# Patient Record
Sex: Male | Born: 1955 | Race: Black or African American | Hispanic: No | Smoking: Current some day smoker
Health system: Southern US, Community
[De-identification: ages and names within clinical notes are randomized; demographics above are authoritative.]

## PROBLEM LIST (undated history)

## (undated) DIAGNOSIS — M1990 Unspecified osteoarthritis, unspecified site: Secondary | ICD-10-CM

## (undated) DIAGNOSIS — I10 Essential (primary) hypertension: Secondary | ICD-10-CM

## (undated) DIAGNOSIS — K219 Gastro-esophageal reflux disease without esophagitis: Secondary | ICD-10-CM

## (undated) DIAGNOSIS — F419 Anxiety disorder, unspecified: Secondary | ICD-10-CM

## (undated) DIAGNOSIS — T7840XA Allergy, unspecified, initial encounter: Secondary | ICD-10-CM

## (undated) HISTORY — DX: Allergy, unspecified, initial encounter: T78.40XA

## (undated) HISTORY — PX: WISDOM TOOTH EXTRACTION: SHX21

## (undated) HISTORY — DX: Gastro-esophageal reflux disease without esophagitis: K21.9

## (undated) HISTORY — DX: Unspecified osteoarthritis, unspecified site: M19.90

## (undated) HISTORY — DX: Essential (primary) hypertension: I10

## (undated) HISTORY — DX: Anxiety disorder, unspecified: F41.9

## (undated) HISTORY — PX: HERNIA REPAIR: SHX51

---

## 2004-09-29 ENCOUNTER — Ambulatory Visit: Payer: Self-pay | Admitting: Pulmonary Disease

## 2005-10-21 ENCOUNTER — Ambulatory Visit: Payer: Self-pay | Admitting: Pulmonary Disease

## 2006-07-20 ENCOUNTER — Ambulatory Visit: Payer: Self-pay | Admitting: Pulmonary Disease

## 2008-03-11 DIAGNOSIS — T7840XA Allergy, unspecified, initial encounter: Secondary | ICD-10-CM | POA: Insufficient documentation

## 2008-03-11 DIAGNOSIS — K219 Gastro-esophageal reflux disease without esophagitis: Secondary | ICD-10-CM | POA: Insufficient documentation

## 2008-03-11 DIAGNOSIS — I10 Essential (primary) hypertension: Secondary | ICD-10-CM

## 2008-10-22 ENCOUNTER — Ambulatory Visit: Payer: Self-pay | Admitting: Pulmonary Disease

## 2008-11-16 DIAGNOSIS — M1990 Unspecified osteoarthritis, unspecified site: Secondary | ICD-10-CM | POA: Insufficient documentation

## 2008-11-16 DIAGNOSIS — F411 Generalized anxiety disorder: Secondary | ICD-10-CM | POA: Insufficient documentation

## 2008-12-31 ENCOUNTER — Ambulatory Visit: Payer: Self-pay | Admitting: Gastroenterology

## 2009-01-19 ENCOUNTER — Ambulatory Visit: Payer: Self-pay | Admitting: Gastroenterology

## 2009-01-19 ENCOUNTER — Encounter: Payer: Self-pay | Admitting: Gastroenterology

## 2009-01-20 ENCOUNTER — Encounter: Payer: Self-pay | Admitting: Gastroenterology

## 2010-12-12 LAB — CONVERTED CEMR LAB
ALT: 17 units/L (ref 0–53)
Albumin: 3.9 g/dL (ref 3.5–5.2)
Basophils Absolute: 0 10*3/uL (ref 0.0–0.1)
Basophils Relative: 0.2 % (ref 0.0–3.0)
CO2: 33 meq/L — ABNORMAL HIGH (ref 19–32)
Calcium: 9.4 mg/dL (ref 8.4–10.5)
Cholesterol: 137 mg/dL (ref 0–200)
Creatinine, Ser: 1.1 mg/dL (ref 0.4–1.5)
Hemoglobin: 14.7 g/dL (ref 13.0–17.0)
LDL Cholesterol: 80 mg/dL (ref 0–99)
Lymphocytes Relative: 31.9 % (ref 12.0–46.0)
MCHC: 33.7 g/dL (ref 30.0–36.0)
MCV: 91.2 fL (ref 78.0–100.0)
Neutro Abs: 2.9 10*3/uL (ref 1.4–7.7)
PSA: 0.93 ng/mL (ref 0.10–4.00)
RBC: 4.79 M/uL (ref 4.22–5.81)
TSH: 2.09 microintl units/mL (ref 0.35–5.50)
Total Bilirubin: 1.3 mg/dL — ABNORMAL HIGH (ref 0.3–1.2)
Total Protein: 6.8 g/dL (ref 6.0–8.3)

## 2011-02-17 ENCOUNTER — Encounter: Payer: Self-pay | Admitting: Pulmonary Disease

## 2011-02-21 ENCOUNTER — Ambulatory Visit (INDEPENDENT_AMBULATORY_CARE_PROVIDER_SITE_OTHER): Payer: BC Managed Care – HMO | Admitting: Pulmonary Disease

## 2011-02-21 ENCOUNTER — Ambulatory Visit (INDEPENDENT_AMBULATORY_CARE_PROVIDER_SITE_OTHER)
Admission: RE | Admit: 2011-02-21 | Discharge: 2011-02-21 | Disposition: A | Discharge status: Home / Self Care | Payer: BC Managed Care – HMO | Source: Ambulatory Visit | Attending: Pulmonary Disease | Admitting: Pulmonary Disease

## 2011-02-21 ENCOUNTER — Encounter: Payer: Self-pay | Admitting: Pulmonary Disease

## 2011-02-21 ENCOUNTER — Other Ambulatory Visit (INDEPENDENT_AMBULATORY_CARE_PROVIDER_SITE_OTHER): Payer: BC Managed Care – HMO

## 2011-02-21 VITALS — BP 126/72 | HR 70 | Temp 97.6°F | Ht 67.0 in | Wt 221.0 lb

## 2011-02-21 DIAGNOSIS — Z0000 Encounter for general adult medical examination without abnormal findings: Secondary | ICD-10-CM

## 2011-02-21 DIAGNOSIS — M1990 Unspecified osteoarthritis, unspecified site: Secondary | ICD-10-CM

## 2011-02-21 DIAGNOSIS — K219 Gastro-esophageal reflux disease without esophagitis: Secondary | ICD-10-CM

## 2011-02-21 DIAGNOSIS — Z0001 Encounter for general adult medical examination with abnormal findings: Secondary | ICD-10-CM | POA: Insufficient documentation

## 2011-02-21 DIAGNOSIS — K649 Unspecified hemorrhoids: Secondary | ICD-10-CM

## 2011-02-21 DIAGNOSIS — K635 Polyp of colon: Secondary | ICD-10-CM | POA: Insufficient documentation

## 2011-02-21 DIAGNOSIS — D126 Benign neoplasm of colon, unspecified: Secondary | ICD-10-CM

## 2011-02-21 LAB — BASIC METABOLIC PANEL
BUN: 13 mg/dL (ref 6–23)
Calcium: 9.1 mg/dL (ref 8.4–10.5)
GFR: 101.31 mL/min (ref >60.00)
Glucose, Bld: 89 mg/dL (ref 70–99)
Sodium: 139 mEq/L (ref 135–145)

## 2011-02-21 LAB — HEPATIC FUNCTION PANEL
AST: 19 U/L (ref 0–37)
Alkaline Phosphatase: 44 U/L (ref 39–117)
Bilirubin, Direct: 0.2 mg/dL (ref 0.0–0.3)
Total Bilirubin: 1.5 mg/dL — ABNORMAL HIGH (ref 0.3–1.2)

## 2011-02-21 LAB — LIPID PANEL
HDL: 43.4 mg/dL (ref >39.00)
LDL Cholesterol: 73 mg/dL (ref 0–99)
VLDL: 11.8 mg/dL (ref 0.0–40.0)

## 2011-02-21 LAB — TSH: TSH: 1.5 u[IU]/mL (ref 0.35–5.50)

## 2011-02-21 LAB — CBC WITH DIFFERENTIAL/PLATELET
Basophils Absolute: 0 10*3/uL (ref 0.0–0.1)
HCT: 43.3 % (ref 39.0–52.0)
Lymphs Abs: 2 10*3/uL (ref 0.7–4.0)
MCV: 91.3 fl (ref 78.0–100.0)
Monocytes Absolute: 0.6 10*3/uL (ref 0.1–1.0)
Neutro Abs: 4.5 10*3/uL (ref 1.4–7.7)
Platelets: 229 10*3/uL (ref 150.0–400.0)
RDW: 12.6 % (ref 11.5–14.6)

## 2011-02-21 NOTE — Assessment & Plan Note (Signed)
He denies reflux, heartburn etc except when eating tomato based foods & knows to avoid whenever poss... He has OTC Prilosec vs Zantac to use Prn when he forgets.Marland KitchenMarland Kitchen

## 2011-02-21 NOTE — Patient Instructions (Signed)
Today we updated your med list in EPIC...  We did your follow up CXR, EKG, & Fasting blood work>    Please call the [facility] in a few days for your results...    Dial [id number] & when prompted enter your patient number followed by the # symbol...    Your patient number is:  [id number]  Let's get on track w/ our diet & exercise progam>    It would be great to lose 20-30 lbs over the next yr...  Call for any problems... Let's plan a follow up eval in 1-2 yrs.Marland KitchenMarland Kitchen

## 2011-02-21 NOTE — Progress Notes (Signed)
Subjective:    Patient ID: Isaiah Rice, male    DOB: [birth year], 55 y.o.   MRN: [id number]  HPI 55 y/o BM here for a follow up visit and CPX... he enjoys good general medical health & has no new complaints or concerns today... He uses several OTC meds as needed> Antihist, Anti-inflamm, Prilosec, Asa daily, topical anti-inflamm cream...  Current Problems:   PHYSICAL EXAMINATION (ICD-V70.0) - good general health;  had routine screening colonoscopy 3/10 by drStark showed 5mm hyperplastic polyp in rectum, otherw neg x hems & f/u rec in 68yrs... DRE is neg & PSAs have been WNL.Marland KitchenMarland Kitchen  Immunizations> doesn't get Flu shots, Pneumovax will be due at 66, ?when last Tetanus was...  ALLERGY (ICD-995.3) - uses OTC antihistamines as needed.  Hx of HYPERTENSION, BORDERLINE (ICD-401.9) - on low sodium diet alone... BP= 126/72, and similar at home... denies HA, fatigue, visual changes, CP, palipit, dizziness, syncope, dyspnea, edema, etc...   ESOPHAGEAL REFLUX (ICD-530.81) - uses OTC H2 blockers vs PPI as needed... states his symptom is mostly related to eating the wrong foods...  DEGENERATIVE JOINT DISEASE (ICD-715.90) - prev on Etodolac as needed, now uses OTC Advil vs Aleve... notes left shoulder and right knee pain on occas and has seen DrBeane for shot in the past...  ANXIETY (ICD-300.00) - stressful period w/ prev divorce, son living at home in college, etc...   Past Medical History  Diagnosis Date  . Allergy, unspecified not elsewhere classified   . Hypertension   . Esophageal reflux, Colon polyp, Hemorrhoid   . DJD (degenerative joint disease)   . Anxiety    Past Surgical History  Procedure Date  . Hernia repair    Outpatient Encounter Prescriptions as of 02/21/2011  Medication Sig Dispense Refill  . aspirin 81 MG tablet Take 81 mg by mouth daily.              . Multiple Vitamins-Minerals (MULTIVITAMIN WITH MINERALS) tablet Take 1 tablet by mouth daily.                     No Known  Allergies  History   Social History  . Marital Status: Divorced    Spouse Name: N/A    Number of Children: 1  . Years of Education: N/A   Occupational History  . Not on file.   Social History Main Topics  . Smoking status: Current Some Day Smoker    Types: Cigars  . Smokeless tobacco: Never Used   Comment: did smoke an occasional cigar once a year  . Alcohol Use: Yes     social etoh  . Drug Use: No  . Sexually Active: Not on file   Other Topics Concern  . Not on file   Social History Narrative  . No narrative on file   Family History  Problem Relation Age of Onset  . Lung cancer Father 19  . Other Mother     DJD and TKR  . Breast cancer Sister     Review of Systems    He notes occas knee pain.  The patient denies fever, chills, sweats, anorexia, fatigue, weakness, malaise, weight loss, sleep disorder, blurring, diplopia, eye irritation, eye discharge, vision loss, eye pain, photophobia, earache, ear discharge, tinnitus, decreased hearing, nasal congestion, nosebleeds, sore throat, hoarseness, chest pain, palpitations, syncope, dyspnea on exertion, orthopnea, PND, peripheral edema, cough, dyspnea at rest, excessive sputum, hemoptysis, wheezing, pleurisy, nausea, vomiting, diarrhea, constipation, change in bowel habits, abdominal pain, melena, hematochezia,  jaundice, gas/bloating, indigestion/heartburn, dysphagia, odynophagia, dysuria, hematuria, urinary frequency, urinary hesitancy, nocturia, incontinence, back pain, muscle cramps, muscle weakness, stiffness, sciatica, restless legs, leg pain at night, leg pain with exertion, rash, itching, dryness, suspicious lesions, paralysis, paresthesias, seizures, tremors, vertigo, transient blindness, frequent falls, frequent headaches, difficulty walking, depression, anxiety, memory loss, confusion, cold intolerance, heat intolerance, polydipsia, polyphagia, polyuria, unusual weight change, abnormal bruising, bleeding, enlarged lymph  nodes, urticaria, allergic rash, hay fever, and recurrent infections.     Objective:   Physical Exam    WD, sl overweight, 55 y/o BM in NAD... GENERAL:  Alert & oriented; pleasant & cooperative... HEENT:  [place]/AT, EOM-wnl, PERRLA, EACs-clear, TMs-wnl, NOSE-clear, THROAT-clear & wnl. NECK:  Supple w/ full ROM; no JVD; normal carotid impulses w/o bruits; no thyromegaly or nodules palpated; no lymphadenopathy. CHEST:  Clear to P & A; without wheezes/ rales/ or rhonchi. HEART:  Regular Rhythm; without murmurs/ rubs/ or gallops. ABDOMEN:  Soft & nontender; normal bowel sounds; no organomegaly or masses detected. RECTAL:  Neg - prostate 2+ & nontender w/o nodules; stool hematest neg. EXT: without deformities, mild arthritic changes in knee; no varicose veins/ venous insuffic/ or edema. NEURO:  CN's intact; motor testing normal; sensory testing normal; gait normal & balance OK. DERM:  No lesions noted; no rash etc...   Assessment & Plan:

## 2011-02-21 NOTE — Assessment & Plan Note (Signed)
Good general health> CXR, EKG, Fasting blood work ==> pending & we will review and get the results to him... Needs better diet & weight reduction> his BMI is ~35 & he will shoot for 20-30 lb weight reduction over the next yr.Marland KitchenMarland Kitchen

## 2011-02-21 NOTE — Assessment & Plan Note (Signed)
He notes occas discomfort in his knees and uses a brace when exercising etc... He gets relief from topical cream that he got from [place], and uses OTC Advil vs Aleve Prn... He will f/u w/ DrBeane Prn.Marland KitchenMarland Kitchen

## 2011-02-25 ENCOUNTER — Telehealth: Payer: Self-pay | Admitting: *Deleted

## 2011-02-25 MED ORDER — DOXYCYCLINE HYCLATE 100 MG PO TABS: 100.0000 mg | ORAL_TABLET | Freq: Two times a day (BID) | ORAL | Status: AC

## 2011-02-25 NOTE — Telephone Encounter (Signed)
Message copied by Abigail Miyamoto on Fri Feb 25, 2011 12:02 PM ------      Message from: NADEL, Lorin Picket      Created: Tue Feb 22, 2011  7:56 PM       Labs essent WNL x incr PSA> could be infection & rec Rx w/ Doxycycline 100mg  bid x10 d...      Then repeat PSA in 1 month without fail, we will call the report...  SN

## 2011-03-28 ENCOUNTER — Telehealth: Payer: Self-pay | Admitting: *Deleted

## 2011-03-28 ENCOUNTER — Other Ambulatory Visit: Payer: Self-pay | Admitting: Pulmonary Disease

## 2011-03-28 ENCOUNTER — Other Ambulatory Visit (INDEPENDENT_AMBULATORY_CARE_PROVIDER_SITE_OTHER): Payer: BC Managed Care – HMO

## 2011-03-28 DIAGNOSIS — R972 Elevated prostate specific antigen [PSA]: Secondary | ICD-10-CM

## 2011-03-28 NOTE — Telephone Encounter (Signed)
Called and spoke with pt and he is aware per SN of psa results after abx.  psa is back to normal and pt is aware that we will follow up in 1 year with repeat test.

## 2011-09-28 ENCOUNTER — Telehealth: Payer: Self-pay | Admitting: Pulmonary Disease

## 2011-09-28 NOTE — Telephone Encounter (Signed)
Pt returned Leigh's call & can be reached at [phone].  Isaiah Rice

## 2011-09-28 NOTE — Telephone Encounter (Signed)
Called and spoke with pts mother and she is going to have him call back to get his symptoms.

## 2011-09-28 NOTE — Telephone Encounter (Signed)
Spoke with Chrissie Noa Saephan-DOB: 03/15/1956. Pt states he is having chest congestion, cough-productive. Pt has appt with TP at 215pm for acute visit. Also, Eean is aware that SN is not taking new patients and I gave him names for PCP downstairs for his son.

## 2011-09-29 ENCOUNTER — Encounter: Payer: Self-pay | Admitting: Adult Health

## 2011-09-29 ENCOUNTER — Ambulatory Visit (INDEPENDENT_AMBULATORY_CARE_PROVIDER_SITE_OTHER): Payer: BC Managed Care – HMO | Admitting: Adult Health

## 2011-09-29 VITALS — BP 120/80 | HR 82 | Temp 97.3°F | Ht 67.0 in | Wt 224.6 lb

## 2011-09-29 DIAGNOSIS — J209 Acute bronchitis, unspecified: Secondary | ICD-10-CM | POA: Insufficient documentation

## 2011-09-29 MED ORDER — HYDROCODONE-HOMATROPINE 5-1.5 MG/5ML PO SYRP: 5.0000 mL | ORAL_SOLUTION | Freq: Four times a day (QID) | ORAL | Status: AC | PRN

## 2011-09-29 MED ORDER — AZITHROMYCIN 250 MG PO TABS: ORAL_TABLET | ORAL | Status: AC

## 2011-09-29 NOTE — Assessment & Plan Note (Signed)
Slow to resolve URI/early bronchitis   Plan;  Zpack take as directed.  Mucinex DM Twice daily  As needed  Cough/congestion  Fluids and rest  Hydromet 1-2 tsp every 4-6 hr As needed  Cough-may make you sleepy  Please contact office for sooner follow up if symptoms do not improve or worsen or seek emergency care

## 2011-09-29 NOTE — Progress Notes (Signed)
  Subjective:    Patient ID: Isaiah Rice, male    DOB: April 17, 1956, 55 y.o.   MRN: [id number]  HPI 55 yo male   09/29/2011 Acute OV  Complains of  chest congestion, drainage, minimally productive cough for 10 days. Taking otc cold meds with minimal relief. No hemoptysis , weight loss, fever or recent travel or abx use.  Cough is keeping him up at night. Starting to get worn down with cough.       Review of Systems Constitutional:   No  weight loss, night sweats,  Fevers, chills,  ++fatigue, or  lassitude.  HEENT:   No headaches,  Difficulty swallowing,  Tooth/dental problems, or  Sore throat,                No sneezing, itching, ear ache,  ++nasal congestion, post nasal drip,   CV:  No chest pain,  Orthopnea, PND, swelling in lower extremities, anasarca, dizziness, palpitations, syncope.   GI  No heartburn, indigestion, abdominal pain, nausea, vomiting, diarrhea, change in bowel habits, loss of appetite, bloody stools.   Resp:   No coughing up of blood.    No chest wall deformity  Skin: no rash or lesions.  GU: no dysuria, change in color of urine, no urgency or frequency.  No flank pain, no hematuria   MS:  No joint pain or swelling.  No decreased range of motion.  No back pain.  Psych:  No change in mood or affect. No depression or anxiety.  No memory loss.         Objective:   Physical Exam GEN: A/Ox3; pleasant , NAD, well nourished   HEENT:  [place]/AT,  EACs-clear, TMs-wnl, NOSE-clear drainage , THROAT-clear, no lesions, no postnasal drip or exudate noted.   NECK:  Supple w/ fair ROM; no JVD; normal carotid impulses w/o bruits; no thyromegaly or nodules palpated; no lymphadenopathy.  RESP  Coarse BS  w/o, wheezes/ rales/ or rhonchi.no accessory muscle use, no dullness to percussion  CARD:  RRR, no m/r/g  , no peripheral edema, pulses intact, no cyanosis or clubbing.  GI:   Soft & nt; nml bowel sounds; no organomegaly or masses detected.  Musco: Warm bil, no  deformities or joint swelling noted.   Neuro: alert, no focal deficits noted.    Skin: Warm, no lesions or rashes         Assessment & Plan:

## 2011-09-29 NOTE — Patient Instructions (Signed)
Zpack take as directed.  Mucinex DM Twice daily  As needed  Cough/congestion  Fluids and rest  Hydromet 1-2 tsp every 4-6 hr As needed  Cough-may make you sleepy  Please contact office for sooner follow up if symptoms do not improve or worsen or seek emergency care

## 2015-10-07 ENCOUNTER — Encounter: Payer: Self-pay | Admitting: Gastroenterology

## 2018-03-22 ENCOUNTER — Encounter: Payer: Self-pay | Admitting: Family Medicine

## 2018-03-22 ENCOUNTER — Ambulatory Visit: Payer: Managed Care, Other (non HMO) | Admitting: Family Medicine

## 2018-03-22 VITALS — BP 142/90 | HR 79 | Ht 67.0 in | Wt 234.0 lb

## 2018-03-22 DIAGNOSIS — Z0001 Encounter for general adult medical examination with abnormal findings: Secondary | ICD-10-CM

## 2018-03-22 DIAGNOSIS — M25571 Pain in right ankle and joints of right foot: Secondary | ICD-10-CM

## 2018-03-22 DIAGNOSIS — G4440 Drug-induced headache, not elsewhere classified, not intractable: Secondary | ICD-10-CM

## 2018-03-22 DIAGNOSIS — I10 Essential (primary) hypertension: Secondary | ICD-10-CM | POA: Diagnosis not present

## 2018-03-22 DIAGNOSIS — N529 Male erectile dysfunction, unspecified: Secondary | ICD-10-CM

## 2018-03-22 DIAGNOSIS — Z125 Encounter for screening for malignant neoplasm of prostate: Secondary | ICD-10-CM | POA: Diagnosis not present

## 2018-03-22 DIAGNOSIS — M25572 Pain in left ankle and joints of left foot: Secondary | ICD-10-CM

## 2018-03-22 LAB — URINALYSIS, ROUTINE W REFLEX MICROSCOPIC
BILIRUBIN URINE: NEGATIVE
Ketones, ur: NEGATIVE
LEUKOCYTES UA: NEGATIVE
NITRITE: NEGATIVE
Specific Gravity, Urine: 1.025 (ref 1.000–1.030)
Total Protein, Urine: 30 — AB
Urine Glucose: NEGATIVE
Urobilinogen, UA: 0.2 (ref 0.0–1.0)
pH: 6 (ref 5.0–8.0)

## 2018-03-22 LAB — CBC
HCT: 46.3 % (ref 39.0–52.0)
Hemoglobin: 15.1 g/dL (ref 13.0–17.0)
MCHC: 32.6 g/dL (ref 30.0–36.0)
MCV: 89.2 fl (ref 78.0–100.0)
Platelets: 263 10*3/uL (ref 150.0–400.0)
RBC: 5.19 Mil/uL (ref 4.22–5.81)
RDW: 12.9 % (ref 11.5–15.5)
WBC: 6.8 10*3/uL (ref 4.0–10.5)

## 2018-03-22 LAB — COMPREHENSIVE METABOLIC PANEL
ALBUMIN: 4.4 g/dL (ref 3.5–5.2)
ALT: 23 U/L (ref 0–53)
AST: 19 U/L (ref 0–37)
Alkaline Phosphatase: 60 U/L (ref 39–117)
BUN: 10 mg/dL (ref 6–23)
CHLORIDE: 103 meq/L (ref 96–112)
CO2: 31 meq/L (ref 19–32)
Calcium: 9.6 mg/dL (ref 8.4–10.5)
Creatinine, Ser: 0.96 mg/dL (ref 0.40–1.50)
GFR: 102.38 mL/min (ref >60.00)
Glucose, Bld: 103 mg/dL — ABNORMAL HIGH (ref 70–99)
POTASSIUM: 4.3 meq/L (ref 3.5–5.1)
SODIUM: 141 meq/L (ref 135–145)
Total Bilirubin: 0.9 mg/dL (ref 0.2–1.2)
Total Protein: 7.2 g/dL (ref 6.0–8.3)

## 2018-03-22 LAB — LIPID PANEL
CHOLESTEROL: 123 mg/dL (ref 0–200)
HDL: 41.2 mg/dL (ref >39.00)
LDL CALC: 68 mg/dL (ref 0–99)
NonHDL: 81.69
TRIGLYCERIDES: 68 mg/dL (ref 0.0–149.0)
Total CHOL/HDL Ratio: 3
VLDL: 13.6 mg/dL (ref 0.0–40.0)

## 2018-03-22 LAB — TSH: TSH: 1.93 u[IU]/mL (ref 0.35–4.50)

## 2018-03-22 LAB — PSA: PSA: 1.43 ng/mL (ref 0.10–4.00)

## 2018-03-22 LAB — MICROALBUMIN / CREATININE URINE RATIO
CREATININE, U: 203.8 mg/dL
MICROALB UR: 21 mg/dL — AB (ref 0.0–1.9)
MICROALB/CREAT RATIO: 10.3 mg/g (ref 0.0–30.0)

## 2018-03-22 MED ORDER — AMLODIPINE BESYLATE 5 MG PO TABS: 5.0000 mg | ORAL_TABLET | Freq: Every day | ORAL | 3 refills | Status: DC

## 2018-03-22 MED ORDER — PREDNISONE 10 MG (21) PO TBPK: ORAL_TABLET | ORAL | 0 refills | Status: DC

## 2018-03-22 MED ORDER — AMLODIPINE BESYLATE 5 MG PO TABS: 5.0000 mg | ORAL_TABLET | Freq: Every day | ORAL | 0 refills | Status: DC

## 2018-03-22 MED ORDER — TADALAFIL 20 MG PO TABS
10.0000 mg | ORAL_TABLET | ORAL | 11 refills | Status: DC | PRN
Start: 2018-03-22 — End: 2022-08-24

## 2018-03-22 NOTE — Progress Notes (Signed)
Subjective:  Patient ID: Isaiah Rice, male    DOB: 1956-02-02  Age: 62 y.o. MRN: [id number]  CC: Establish Care   HPI Isaiah Rice presents for a physical exam and for follow-up of some medical concerns that he has.  He is currently single.  He lives alone.  He has 2 grown children stepdaughter and a son.  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  He comes on home on the weekends.  He smokes maybe 5 cigarettes a year.  He has up to 2-3 beers a week and weekend nights only.  He is currently not exercising.  He does not use street drugs.  He has a history of an elevated PSA in the distant past that normalized.  He is currently not having any issues with his urine flow or nocturia.  He does have a significant other and has been using some herbal preparations for occasional ED issues.  He is used Cialis in the past with success.  He tells me that his blood pressure has been elevated throughout the years but never treated.  It is typically been running in the 1 40-1 50 range.  In his distant past it is been as high as 180.  He also has been having headaches over the last year or so.  He can wake up with these headaches.  They are nonprogressive and generalized.  He denies pre-or post stromal symptoms.  Denies nausea and vomiting.  Denies dysphagia or unilateral weakness or paresthesias.  He has been taking BC powders for these headaches approximately 3 times a week.  He has an ongoing history of bilateral shoulder pain and has been diagnosed with chronic bursitis.  He says that he has some pain and swelling in his feet in the morning time.  History Isaiah Rice has a past medical history of Allergy, unspecified not elsewhere classified, Anxiety, DJD (degenerative joint disease), Esophageal reflux, and Hypertension.   He has a past surgical history that includes Hernia repair.   His family history includes Breast cancer in his sister; Lung cancer (age of onset: 61) in his  father; Other in his mother.He reports that he has been smoking cigars.  He has never used smokeless tobacco. He reports that he drinks alcohol. He reports that he does not use drugs.  Outpatient Medications Prior to Visit  Medication Sig Dispense Refill  . aspirin 81 MG tablet Take 81 mg by mouth daily.      . clotrimazole-betamethasone (LOTRISONE) cream Apply 1 application topically 2 (two) times daily.      Marland Kitchen etodolac (LODINE) 300 MG capsule Take 300 mg by mouth every 8 (eight) hours as needed.      . Multiple Vitamins-Minerals (MULTIVITAMIN WITH MINERALS) tablet Take 1 tablet by mouth daily.      . tadalafil (CIALIS) 20 MG tablet Take 20 mg by mouth daily as needed.       No facility-administered medications prior to visit.     ROS Review of Systems  Constitutional: Negative for chills, fatigue, fever and unexpected weight change.  HENT: Negative for congestion, postnasal drip, rhinorrhea, sinus pressure, sinus pain and sneezing.   Eyes: Negative for photophobia and visual disturbance.  Respiratory: Negative for chest tightness, shortness of breath and wheezing.   Cardiovascular: Negative for chest pain and palpitations.  Gastrointestinal: Negative.  Negative for anal bleeding and blood in stool.  Endocrine: Negative for polyphagia and polyuria.  Genitourinary: Negative for difficulty urinating, frequency  and urgency.  Musculoskeletal: Positive for arthralgias.  Skin: Negative.   Allergic/Immunologic: Negative for immunocompromised state.  Neurological: Positive for headaches. Negative for speech difficulty and weakness.  Hematological: Does not bruise/bleed easily.  Psychiatric/Behavioral: Negative.     Objective:  BP (!) 142/90   Pulse 79   Ht 5\' 7"  (1.702 m)   Wt 234 lb (106.1 kg)   SpO2 95%   BMI 36.65 kg/m   Physical Exam  Constitutional: He is oriented to person, place, and time. He appears well-developed and well-nourished. No distress.  HENT:  Head:  Normocephalic and atraumatic.  Right Ear: External ear normal.  Left Ear: External ear normal.  Nose: Nose normal.  Mouth/Throat: Oropharynx is clear and moist. No oropharyngeal exudate.  Eyes: Pupils are equal, round, and reactive to light. Conjunctivae and EOM are normal. Right eye exhibits no discharge. Left eye exhibits no discharge. No scleral icterus.  Neck: Normal range of motion. Neck supple. No JVD present. No tracheal deviation present. No thyromegaly present.  Cardiovascular: Normal rate, regular rhythm and normal heart sounds.  Pulmonary/Chest: Effort normal and breath sounds normal.  Abdominal: Soft. Bowel sounds are normal. He exhibits distension. He exhibits no mass. There is no tenderness. There is no rebound and no guarding.  Genitourinary: Rectal exam shows no external hemorrhoid, no internal hemorrhoid, no fissure, no mass, no tenderness, anal tone normal and guaiac negative stool. Prostate is enlarged. Prostate is not tender.  Musculoskeletal: He exhibits no edema, tenderness or deformity.  Lymphadenopathy:    He has no cervical adenopathy.  Neurological: He is alert and oriented to person, place, and time. No cranial nerve deficit. Coordination normal.  Skin: Skin is warm. No rash noted. He is not diaphoretic. No erythema. No pallor.  Psychiatric: He has a normal mood and affect. His behavior is normal.      Assessment & Plan:   Isaiah Rice was seen today for establish care.  Diagnoses and all orders for this visit:  Essential hypertension -     CBC -     Comprehensive metabolic panel -     TSH -     Urinalysis, Routine w reflex microscopic -     Microalbumin / creatinine urine ratio -     Discontinue: amLODipine (NORVASC) 5 MG tablet; Take 1 tablet (5 mg total) by mouth daily. -     amLODipine (NORVASC) 5 MG tablet; Take 1 tablet (5 mg total) by mouth daily.  Rebound headache -     CBC -     Comprehensive metabolic panel -     TSH -     predniSONE (STERAPRED  UNI-PAK 21 TAB) 10 MG (21) TBPK tablet; Take 6 pills first day, then 5,4, 3, 2 and 1.  Encounter for health maintenance examination with abnormal findings -     CBC -     Comprehensive metabolic panel -     HIV antibody -     Lipid panel -     PSA  Arthralgia of both ankles -     Rheumatoid Factor  Vasculogenic erectile dysfunction, unspecified vasculogenic erectile dysfunction type -     tadalafil (CIALIS) 20 MG tablet; Take 0.5-1 tablets (10-20 mg total) by mouth every other day as needed for erectile dysfunction.   I have discontinued Luanna Cole. Veltre's aspirin, tadalafil, clotrimazole-betamethasone, etodolac, and multivitamin with minerals. I am also having him start on predniSONE and tadalafil. Additionally, I am having him maintain his amLODipine.  Meds ordered this encounter  Medications  . DISCONTD: amLODipine (NORVASC) 5 MG tablet    Sig: Take 1 tablet (5 mg total) by mouth daily.    Dispense:  90 tablet    Refill:  3  . predniSONE (STERAPRED UNI-PAK 21 TAB) 10 MG (21) TBPK tablet    Sig: Take 6 pills first day, then 5,4, 3, 2 and 1.    Dispense:  21 tablet    Refill:  0  . tadalafil (CIALIS) 20 MG tablet    Sig: Take 0.5-1 tablets (10-20 mg total) by mouth every other day as needed for erectile dysfunction.    Dispense:  5 tablet    Refill:  11  . amLODipine (NORVASC) 5 MG tablet    Sig: Take 1 tablet (5 mg total) by mouth daily.    Dispense:  90 tablet    Refill:  0   Patient was given information about the DASH diet and exercising to lose weight.  He is motivated to lose weight and go to the gym because I encouraged him to lose 10 to 15% of his body weight and that could lead to discontinuation of his blood pressure medicine.  He will follow-up in 6 weeks and hopefully his headaches will have improved by that time.  Low he has he has okay my ankles doing better and we can go ahead and cancel it okay that they stop by a low somewhat he called me from this  number  Follow-up: Return in about 6 weeks (around 05/03/2018).  Libby Maw, MD

## 2018-03-22 NOTE — Patient Instructions (Signed)
DASH Eating Plan DASH stands for "Dietary Approaches to Stop Hypertension." The DASH eating plan is a healthy eating plan that has been shown to reduce high blood pressure (hypertension). It may also reduce your risk for type 2 diabetes, heart disease, and stroke. The DASH eating plan may also help with weight loss. What are tips for following this plan? General guidelines  Avoid eating more than 2,300 mg (milligrams) of salt (sodium) a day. If you have hypertension, you may need to reduce your sodium intake to 1,500 mg a day.  Limit alcohol intake to no more than 1 drink a day for nonpregnant women and 2 drinks a day for men. One drink equals 12 oz of beer, 5 oz of wine, or 1 oz of hard liquor.  Work with your health care provider to maintain a healthy body weight or to lose weight. Ask what an ideal weight is for you.  Get at least 30 minutes of exercise that causes your heart to beat faster (aerobic exercise) most days of the week. Activities may include walking, swimming, or biking.  Work with your health care provider or diet and nutrition specialist (dietitian) to adjust your eating plan to your individual calorie needs. Reading food labels  Check food labels for the amount of sodium per serving. Choose foods with less than 5 percent of the Daily Value of sodium. Generally, foods with less than 300 mg of sodium per serving fit into this eating plan.  To find whole grains, look for the word "whole" as the first word in the ingredient list. Shopping  Buy products labeled as "low-sodium" or "no salt added."  Buy fresh foods. Avoid canned foods and premade or frozen meals. Cooking  Avoid adding salt when cooking. Use salt-free seasonings or herbs instead of table salt or sea salt. Check with your health care provider or pharmacist before using salt substitutes.  Do not fry foods. Cook foods using healthy methods such as baking, boiling, grilling, and broiling instead.  Cook with  heart-healthy oils, such as olive, canola, soybean, or sunflower oil. Meal planning   Eat a balanced diet that includes: ? 5 or more servings of fruits and vegetables each day. At each meal, try to fill half of your plate with fruits and vegetables. ? Up to 6-8 servings of whole grains each day. ? Less than 6 oz of lean meat, poultry, or fish each day. A 3-oz serving of meat is about the same size as a deck of cards. One egg equals 1 oz. ? 2 servings of low-fat dairy each day. ? A serving of nuts, seeds, or beans 5 times each week. ? Heart-healthy fats. Healthy fats called Omega-3 fatty acids are found in foods such as flaxseeds and coldwater fish, like sardines, salmon, and mackerel.  Limit how much you eat of the following: ? Canned or prepackaged foods. ? Food that is high in trans fat, such as fried foods. ? Food that is high in saturated fat, such as fatty meat. ? Sweets, desserts, sugary drinks, and other foods with added sugar. ? Full-fat dairy products.  Do not salt foods before eating.  Try to eat at least 2 vegetarian meals each week.  Eat more home-cooked food and less restaurant, buffet, and fast food.  When eating at a restaurant, ask that your food be prepared with less salt or no salt, if possible. What foods are recommended? The items listed may not be a complete list. Talk with your dietitian about what   dietary choices are best for you. Grains Whole-grain or whole-wheat bread. Whole-grain or whole-wheat pasta. Brown rice. [place]. Bulgur. Whole-grain and low-sodium cereals. Pita bread. Low-fat, low-sodium crackers. Whole-wheat flour tortillas. Vegetables Fresh or frozen vegetables (raw, steamed, roasted, or grilled). Low-sodium or reduced-sodium tomato and vegetable juice. Low-sodium or reduced-sodium tomato sauce and tomato paste. Low-sodium or reduced-sodium canned vegetables. Fruits All fresh, dried, or frozen fruit. Canned fruit in natural juice (without  added sugar). Meat and other protein foods Skinless chicken or [place]. Ground chicken or [place]. Pork with fat trimmed off. Fish and seafood. Egg whites. Dried beans, peas, or lentils. Unsalted nuts, nut butters, and seeds. Unsalted canned beans. Lean cuts of beef with fat trimmed off. Low-sodium, lean deli meat. Dairy Low-fat (1%) or fat-free (skim) milk. Fat-free, low-fat, or reduced-fat cheeses. Nonfat, low-sodium ricotta or cottage cheese. Low-fat or nonfat yogurt. Low-fat, low-sodium cheese. Fats and oils Soft margarine without trans fats. Vegetable oil. Low-fat, reduced-fat, or light mayonnaise and salad dressings (reduced-sodium). Canola, safflower, olive, soybean, and sunflower oils. Avocado. Seasoning and other foods Herbs. Spices. Seasoning mixes without salt. Unsalted popcorn and pretzels. Fat-free sweets. What foods are not recommended? The items listed may not be a complete list. Talk with your dietitian about what dietary choices are best for you. Grains Baked goods made with fat, such as croissants, muffins, or some breads. Dry pasta or rice meal packs. Vegetables Creamed or fried vegetables. Vegetables in a cheese sauce. Regular canned vegetables (not low-sodium or reduced-sodium). Regular canned tomato sauce and paste (not low-sodium or reduced-sodium). Regular tomato and vegetable juice (not low-sodium or reduced-sodium). Angie Fava. Olives. Fruits Canned fruit in a light or heavy syrup. Fried fruit. Fruit in cream or butter sauce. Meat and other protein foods Fatty cuts of meat. Ribs. Fried meat. Berniece Salines. Sausage. Bologna and other processed lunch meats. Salami. Fatback. Hotdogs. Bratwurst. Salted nuts and seeds. Canned beans with added salt. Canned or smoked fish. Whole eggs or egg yolks. Chicken or [place] with skin. Dairy Whole or 2% milk, cream, and half-and-half. Whole or full-fat cream cheese. Whole-fat or sweetened yogurt. Full-fat cheese. Nondairy creamers. Whipped toppings.  Processed cheese and cheese spreads. Fats and oils Butter. Stick margarine. Lard. Shortening. Ghee. Bacon fat. Tropical oils, such as coconut, palm kernel, or palm oil. Seasoning and other foods Salted popcorn and pretzels. Onion salt, garlic salt, seasoned salt, table salt, and sea salt. Worcestershire sauce. Tartar sauce. Barbecue sauce. Teriyaki sauce. Soy sauce, including reduced-sodium. Steak sauce. Canned and packaged gravies. Fish sauce. Oyster sauce. Cocktail sauce. Horseradish that you find on the shelf. Ketchup. Mustard. Meat flavorings and tenderizers. Bouillon cubes. Hot sauce and Tabasco sauce. Premade or packaged marinades. Premade or packaged taco seasonings. Relishes. Regular salad dressings. Where to find more information:  National Heart, Lung, and [place]: https://wilson-eaton.com/  American Heart Association: www.heart.org Summary  The DASH eating plan is a healthy eating plan that has been shown to reduce high blood pressure (hypertension). It may also reduce your risk for type 2 diabetes, heart disease, and stroke.  With the DASH eating plan, you should limit salt (sodium) intake to 2,300 mg a day. If you have hypertension, you may need to reduce your sodium intake to 1,500 mg a day.  When on the DASH eating plan, aim to eat more fresh fruits and vegetables, whole grains, lean proteins, low-fat dairy, and heart-healthy fats.  Work with your health care provider or diet and nutrition specialist (dietitian) to adjust your eating plan to your individual  calorie needs. This information is not intended to replace advice given to you by your health care provider. Make sure you discuss any questions you have with your health care provider. Document Released: 10/20/2011 Document Revised: 10/24/2016 Document Reviewed: 10/24/2016 Elsevier Interactive Patient Education  2018 [employer].  How to Increase Your Level of Physical Activity Getting regular physical activity is  important for your overall health and well-being. Most people do not get enough exercise. There are easy ways to increase your level of physical activity, even if you have not been very active in the past or you are just starting out. Why is physical activity important? Physical activity has many short-term and long-term health benefits. Regular exercise can:  Help you lose weight or maintain a healthy weight.  Strengthen your muscles and bones.  Boost your mood and improve self-esteem.  Reduce your risk of certain long-term (chronic) diseases, like heart disease, cancer, and diabetes.  Help you stay capable of walking and moving around (mobile) as you age.  Prevent accidents, such as falls, as you age.  Increase life expectancy.  What are the benefits of being physically active on a regular basis? In addition to improving your physical health, being physically active on most days of the week can help you in ways that you may not expect. Benefits of regular physical activity may include:  Feeling good about your body.  Being able to move around more easily and for longer periods of time without getting tired (increased stamina).  Finding new sources of fun and enjoyment.  Meeting new people who share a common interest.  Being able to fight off illness better (enhanced immunity).  Being able to sleep better.  What can happen if I am not physically active on a regular basis? Not getting enough physical activity can lead to an unhealthy lifestyle and future health problems. This can increase your chances of:  Becoming overweight or obese.  Becoming sick.  Developing chronic illnesses, like heart disease or diabetes.  Having mental health problems, like depression or anxiety.  Having sleep problems.  Having trouble walking or getting yourself around (reduced mobility).  Injuring yourself in a fall as you get older.  What steps can I take to be more physically  active?  Check with your health care provider about how to get started. Ask your health care provider what activities are safe for you.  Start out slowly. Walking or doing some simple chair exercises is a good place to start, especially if you have not been active before or for a long time.  Try to find activities that you enjoy. You are more likely to commit to an exercise routine if it does not feel like a chore.  If you have bone or joint problems, choose low-impact exercises, like walking or swimming.  Include physical activity in your everyday routine.  Invite friends or family members to exercise with you. This also will help you commit to your workout plan.  Set goals that you can work toward.  Aim for at least 150 minutes of moderate-intensity exercise each week. Examples of moderate-intensity exercise include walking or riding a bike. Where to find more information:  Centers for Disease Control and Prevention: BowlingGrip.is  President's Council on [employer], Sports & Nutrition www.http://villegas.org/  ChooseMyPlate: WirelessMortgages.dk Contact a health care provider if:  You have headaches, muscle aches, or joint pain.  You feel dizzy or light-headed while exercising.  You faint.  You have chest pain while exercising. Summary  Exercise benefits your mind and body at any age, even if you are just starting out.  If you have a chronic illness or have not been active for a while, check with your health care provider before increasing your physical activity.  Choose activities that are safe and enjoyable for you.Ask your health care provider what activities are safe for you.  Start slowly. Tell your health care provider if you have problems as you start to increase your activity level. This information is not intended to replace advice given to you by your health care provider. Make sure you discuss any questions you  have with your health care provider. Document Released: 10/20/2016 Document Revised: 10/20/2016 Document Reviewed: 10/20/2016 Elsevier Interactive Patient Education  2018 [employer].  How to Take Your Blood Pressure Blood pressure is a measurement of how strongly your blood is pressing against the walls of your arteries. Arteries are blood vessels that carry blood from your heart throughout your body. Your health care provider takes your blood pressure at each office visit. You can also take your own blood pressure at home with a blood pressure machine. You may need to take your own blood pressure:  To confirm a diagnosis of high blood pressure (hypertension).  To monitor your blood pressure over time.  To make sure your blood pressure medicine is working.  Supplies needed: To take your blood pressure, you will need a blood pressure machine. You can buy a blood pressure machine, or blood pressure monitor, at most drugstores or online. There are several types of home blood pressure monitors. When choosing one, consider the following:  Choose a monitor that has an arm cuff.  Choose a monitor that wraps snugly around your upper arm. You should be able to fit only one finger between your arm and the cuff.  Do not choose a monitor that measures your blood pressure from your wrist or finger.  Your health care provider can suggest a reliable monitor that will meet your needs. How to prepare To get the most accurate reading, avoid the following for 30 minutes before you check your blood pressure:  Drinking caffeine.  Drinking alcohol.  Eating.  Smoking.  Exercising.  Five minutes before you check your blood pressure:  Empty your bladder.  Sit quietly without talking in a dining chair, rather than in a soft couch or armchair.  How to take your blood pressure To check your blood pressure, follow the instructions in the manual that came with your blood pressure monitor. If you have  a digital blood pressure monitor, the instructions may be as follows: 1. Sit up straight. 2. Place your feet on the floor. Do not cross your ankles or legs. 3. Rest your left arm at the level of your heart on a table or desk or on the arm of a chair. 4. Pull up your shirt sleeve. 5. Wrap the blood pressure cuff around the upper part of your left arm, 1 inch (2.5 cm) above your elbow. It is best to wrap the cuff around bare skin. 6. Fit the cuff snugly around your arm. You should be able to place only one finger between the cuff and your arm. 7. Position the cord inside the groove of your elbow. 8. Press the power button. 9. Sit quietly while the cuff inflates and deflates. 10. Read the digital reading on the monitor screen and write it down (record it). 11. Wait 2-3 minutes, then repeat the steps, starting at step 1.  What  does my blood pressure reading mean? A blood pressure reading consists of a higher number over a lower number. Ideally, your blood pressure should be below 120/80. The first ("top") number is called the systolic pressure. It is a measure of the pressure in your arteries as your heart beats. The second ("bottom") number is called the diastolic pressure. It is a measure of the pressure in your arteries as the heart relaxes. Blood pressure is classified into four stages. The following are the stages for adults who do not have a short-term serious illness or a chronic condition. Systolic pressure and diastolic pressure are measured in a unit called mm Hg. Normal  Systolic pressure: below 161.  Diastolic pressure: below 80. Elevated  Systolic pressure: 096-045.  Diastolic pressure: below 80. Hypertension stage 1  Systolic pressure: 409-811.  Diastolic pressure: 91-47. Hypertension stage 2  Systolic pressure: 829 or above.  Diastolic pressure: 90 or above. You can have prehypertension or hypertension even if only the systolic or only the diastolic number in your  reading is higher than normal. Follow these instructions at home:  Check your blood pressure as often as recommended by your health care provider.  Take your monitor to the next appointment with your health care provider to make sure: ? That you are using it correctly. ? That it provides accurate readings.  Be sure you understand what your goal blood pressure numbers are.  Tell your health care provider if you are having any side effects from blood pressure medicine. Contact a health care provider if:  Your blood pressure is consistently high. Get help right away if:  Your systolic blood pressure is higher than 180.  Your diastolic blood pressure is higher than 110. This information is not intended to replace advice given to you by your health care provider. Make sure you discuss any questions you have with your health care provider. Document Released: 04/08/2016 Document Revised: 06/21/2016 Document Reviewed: 04/08/2016 Elsevier Interactive Patient Education  2018 Irene Years, Male Preventive care refers to lifestyle choices and visits with your health care provider that can promote health and wellness. What does preventive care include?  A yearly physical exam. This is also called an annual well check.  Dental exams once or twice a year.  Routine eye exams. Ask your health care provider how often you should have your eyes checked.  Personal lifestyle choices, including: ? Daily care of your teeth and gums. ? Regular physical activity. ? Eating a healthy diet. ? Avoiding tobacco and drug use. ? Limiting alcohol use. ? Practicing safe sex. ? Taking low-dose aspirin every day starting at age 83. What happens during an annual well check? The services and screenings done by your health care provider during your annual well check will depend on your age, overall health, lifestyle risk factors, and family history of disease. Counseling Your  health care provider may ask you questions about your:  Alcohol use.  Tobacco use.  Drug use.  Emotional well-being.  Home and relationship well-being.  Sexual activity.  Eating habits.  Work and work Statistician.  Screening You may have the following tests or measurements:  Height, weight, and BMI.  Blood pressure.  Lipid and cholesterol levels. These may be checked every 5 years, or more frequently if you are over 36 years old.  Skin check.  Lung cancer screening. You may have this screening every year starting at age 36 if you have a 30-pack-year history of smoking and  currently smoke or have quit within the past 15 years.  Fecal occult blood test (FOBT) of the stool. You may have this test every year starting at age 72.  Flexible sigmoidoscopy or colonoscopy. You may have a sigmoidoscopy every 5 years or a colonoscopy every 10 years starting at age 61.  Prostate cancer screening. Recommendations will vary depending on your family history and other risks.  Hepatitis C blood test.  Hepatitis B blood test.  Sexually transmitted disease (STD) testing.  Diabetes screening. This is done by checking your blood sugar (glucose) after you have not eaten for a while (fasting). You may have this done every 1-3 years.  Discuss your test results, treatment options, and if necessary, the need for more tests with your health care provider. Vaccines Your health care provider may recommend certain vaccines, such as:  Influenza vaccine. This is recommended every year.  Tetanus, diphtheria, and acellular pertussis (Tdap, Td) vaccine. You may need a Td booster every 10 years.  Varicella vaccine. You may need this if you have not been vaccinated.  Zoster vaccine. You may need this after age 63.  Measles, mumps, and rubella (MMR) vaccine. You may need at least one dose of MMR if you were born in 1957 or later. You may also need a second dose.  Pneumococcal 13-valent conjugate  (PCV13) vaccine. You may need this if you have certain conditions and have not been vaccinated.  Pneumococcal polysaccharide (PPSV23) vaccine. You may need one or two doses if you smoke cigarettes or if you have certain conditions.  Meningococcal vaccine. You may need this if you have certain conditions.  Hepatitis A vaccine. You may need this if you have certain conditions or if you travel or work in places where you may be exposed to hepatitis A.  Hepatitis B vaccine. You may need this if you have certain conditions or if you travel or work in places where you may be exposed to hepatitis B.  Haemophilus influenzae type b (Hib) vaccine. You may need this if you have certain risk factors.  Talk to your health care provider about which screenings and vaccines you need and how often you need them. This information is not intended to replace advice given to you by your health care provider. Make sure you discuss any questions you have with your health care provider. Document Released: 11/27/2015 Document Revised: 07/20/2016 Document Reviewed: 09/01/2015 Elsevier Interactive Patient Education  [employer].

## 2018-03-23 LAB — HIV ANTIBODY (ROUTINE TESTING W REFLEX): HIV 1&2 Ab, 4th Generation: NONREACTIVE

## 2018-03-23 LAB — RHEUMATOID FACTOR

## 2018-03-30 ENCOUNTER — Telehealth: Payer: Self-pay | Admitting: Family Medicine

## 2018-03-30 NOTE — Telephone Encounter (Signed)
Copied from [place] [phone]. Topic: Quick Communication - Lab Results >> Mar 23, 2018 10:53 AM Self, Rande Brunt, CMA wrote: Called patient to inform them of his lab results. When patient returns call, triage nurse may disclose results. [phone] (M)

## 2018-03-30 NOTE — Telephone Encounter (Signed)
Pt given results per notes of Dr. Ethelene Hal on 03/23/18, patient verbalized understanding. Unable to document in result note due to result note not being routed to [facility]

## 2018-05-03 ENCOUNTER — Ambulatory Visit: Payer: Managed Care, Other (non HMO) | Admitting: Family Medicine

## 2018-05-03 ENCOUNTER — Encounter: Payer: Self-pay | Admitting: Family Medicine

## 2018-05-03 VITALS — BP 140/92 | HR 85 | Temp 98.9°F | Ht 67.0 in | Wt 223.5 lb

## 2018-05-03 DIAGNOSIS — R5383 Other fatigue: Secondary | ICD-10-CM | POA: Diagnosis not present

## 2018-05-03 DIAGNOSIS — J209 Acute bronchitis, unspecified: Secondary | ICD-10-CM

## 2018-05-03 DIAGNOSIS — I10 Essential (primary) hypertension: Secondary | ICD-10-CM

## 2018-05-03 DIAGNOSIS — R7309 Other abnormal glucose: Secondary | ICD-10-CM | POA: Insufficient documentation

## 2018-05-03 MED ORDER — AMOXICILLIN 500 MG PO CAPS: 500.0000 mg | ORAL_CAPSULE | Freq: Three times a day (TID) | ORAL | 0 refills | Status: DC

## 2018-05-03 MED ORDER — CYANOCOBALAMIN 1000 MCG/ML IJ SOLN
1000.0000 ug | Freq: Once | INTRAMUSCULAR | Status: AC
  Administered 2018-05-03: 1000 ug via INTRAMUSCULAR

## 2018-05-03 MED ORDER — AMLODIPINE BESYLATE 5 MG PO TABS: 5.0000 mg | ORAL_TABLET | Freq: Every day | ORAL | 1 refills | Status: DC

## 2018-05-03 NOTE — Progress Notes (Signed)
Subjective:  Patient ID: Isaiah Rice, male    DOB: November 23, 1955  Age: 62 y.o. MRN: [id number]  CC: Follow-up   HPI ESTHER BROYLES presents for follow-up of his hypertension.  He has been compliant with his Norvasc.  His headaches have diminished.  He is having no problems with the Norvasc.  He is actually lost 11 pounds.  He is not checking his blood pressure at home.  Blood pressure remains borderline.  Discussed JNC guidelines of allowing a blood pressure of 150/90 or less for people over 60.  Patient admits that he needs to get back into the habit of going to the gym.  Advised him that from a health standpoint there is no reason he can start working out again.  He would like to lose even more weight.  His blood sugar was slightly elevated.  We discussed that this puts him in the prediabetic range.  He has had a URI for the last week.  He has been treating it with Mucinex.  He reports stuffy nose drainage and a nonproductive cough.  There is been no fever.  Denies wheezing.  Outpatient Medications Prior to Visit  Medication Sig Dispense Refill  . tadalafil (CIALIS) 20 MG tablet Take 0.5-1 tablets (10-20 mg total) by mouth every other day as needed for erectile dysfunction. 5 tablet 11  . amLODipine (NORVASC) 5 MG tablet Take 1 tablet (5 mg total) by mouth daily. 90 tablet 0  . predniSONE (STERAPRED UNI-PAK 21 TAB) 10 MG (21) TBPK tablet Take 6 pills first day, then 5,4, 3, 2 and 1. 21 tablet 0   No facility-administered medications prior to visit.     ROS Review of Systems  Constitutional: Negative for chills, fatigue, fever and unexpected weight change.  HENT: Positive for congestion, postnasal drip and rhinorrhea. Negative for sinus pressure, sinus pain and sore throat.   Eyes: Negative for photophobia and visual disturbance.  Respiratory: Positive for cough. Negative for shortness of breath and wheezing.   Cardiovascular: Negative for chest pain and palpitations.    Gastrointestinal: Negative.   Endocrine: Negative for polyphagia and polyuria.  Genitourinary: Negative.   Musculoskeletal: Negative for arthralgias and myalgias.  Skin: Negative for pallor and rash.  Allergic/Immunologic: Negative for immunocompromised state.  Neurological: Negative for weakness and headaches.  Hematological: Does not bruise/bleed easily.  Psychiatric/Behavioral: Negative.     Objective:  BP (!) 140/92   Pulse 85   Temp 98.9 F (37.2 C)   Ht 5\' 7"  (1.702 m)   Wt 223 lb 8 oz (101.4 kg)   SpO2 95%   BMI 35.01 kg/m   BP Readings from Last 3 Encounters:  05/03/18 (!) 140/92  03/22/18 (!) 142/90  09/29/11 120/80    Wt Readings from Last 3 Encounters:  05/03/18 223 lb 8 oz (101.4 kg)  03/22/18 234 lb (106.1 kg)  09/29/11 224 lb 9.6 oz (101.9 kg)    Physical Exam  Constitutional: He is oriented to person, place, and time. He appears well-developed and well-nourished. No distress.  HENT:  Head: Normocephalic and atraumatic.  Right Ear: External ear normal.  Left Ear: External ear normal.  Nose: Nose normal.  Mouth/Throat: Oropharynx is clear and moist. No oropharyngeal exudate.  Eyes: Pupils are equal, round, and reactive to light. Conjunctivae and EOM are normal. Right eye exhibits no discharge. Left eye exhibits no discharge. No scleral icterus.  Neck: Normal range of motion. Neck supple. No JVD present. No tracheal deviation present. No thyromegaly  present.  Cardiovascular: Normal rate, regular rhythm and normal heart sounds.  Pulmonary/Chest: Effort normal and breath sounds normal.  Lymphadenopathy:    He has no cervical adenopathy.  Neurological: He is alert and oriented to person, place, and time.  Skin: Skin is warm and dry. Capillary refill takes less than 2 seconds. He is not diaphoretic.  Psychiatric: He has a normal mood and affect. His behavior is normal.    Lab Results  Component Value Date   WBC 6.8 03/22/2018   HGB 15.1 03/22/2018    HCT 46.3 03/22/2018   PLT 263.0 03/22/2018   GLUCOSE 103 (H) 03/22/2018   CHOL 123 03/22/2018   TRIG 68.0 03/22/2018   HDL 41.20 03/22/2018   LDLCALC 68 03/22/2018   ALT 23 03/22/2018   AST 19 03/22/2018   NA 141 03/22/2018   K 4.3 03/22/2018   CL 103 03/22/2018   CREATININE 0.96 03/22/2018   BUN 10 03/22/2018   CO2 31 03/22/2018   TSH 1.93 03/22/2018   PSA 1.43 03/22/2018   MICROALBUR 21.0 (H) 03/22/2018    Dg Chest 2 View  Result Date: 02/21/2011 *RADIOLOGY REPORT* Clinical Data: Health maintenance examination. CHEST - 2 VIEW Comparison: 10/22/2008. Findings: Heart is upper normal size. The lungs are well aerated and free of infiltrates. No pleural abnormality is evident.  No hilar enlargement is seen. Bones appear average for age. IMPRESSION: No thoracic abnormality is demonstrated. Original Report Authenticated By: Delane Ginger, M.D.   Assessment & Plan:   Normon was seen today for follow-up.  Diagnoses and all orders for this visit:  Essential hypertension -     amLODipine (NORVASC) 5 MG tablet; Take 1 tablet (5 mg total) by mouth daily.  Acute bronchitis, unspecified organism -     amoxicillin (AMOXIL) 500 MG capsule; Take 1 capsule (500 mg total) by mouth 3 (three) times daily.  Elevated glucose  Fatigue, unspecified type -     cyanocobalamin ((VITAMIN B-12)) injection 1,000 mcg   I have discontinued Gwyndolyn Saxon C. Cartelli's predniSONE. I am also having him start on amoxicillin. Additionally, I am having him maintain his tadalafil and amLODipine. We administered cyanocobalamin.  Meds ordered this encounter  Medications  . amoxicillin (AMOXIL) 500 MG capsule    Sig: Take 1 capsule (500 mg total) by mouth 3 (three) times daily.    Dispense:  30 capsule    Refill:  0  . cyanocobalamin ((VITAMIN B-12)) injection 1,000 mcg  . amLODipine (NORVASC) 5 MG tablet    Sig: Take 1 tablet (5 mg total) by mouth daily.    Dispense:  90 tablet    Refill:  1   Encourage  patient to start an exercise program and continue weight loss.  Believe that this will help his blood pressure as well as his blood sugar.  He complains of some fatigue and exercise would more than likely help that as well.  He requested a B12 shot today.  If this helps him feel better he may want to try B complex.  Follow-up in 6 months for recheck.  Follow-up: Return in about 6 months (around 11/02/2018), or if symptoms worsen or fail to improve.  Libby Maw, MD

## 2018-05-03 NOTE — Patient Instructions (Signed)
DASH Eating Plan DASH stands for "Dietary Approaches to Stop Hypertension." The DASH eating plan is a healthy eating plan that has been shown to reduce high blood pressure (hypertension). It may also reduce your risk for type 2 diabetes, heart disease, and stroke. The DASH eating plan may also help with weight loss. What are tips for following this plan? General guidelines  Avoid eating more than 2,300 mg (milligrams) of salt (sodium) a day. If you have hypertension, you may need to reduce your sodium intake to 1,500 mg a day.  Limit alcohol intake to no more than 1 drink a day for nonpregnant women and 2 drinks a day for men. One drink equals 12 oz of beer, 5 oz of wine, or 1 oz of hard liquor.  Work with your health care provider to maintain a healthy body weight or to lose weight. Ask what an ideal weight is for you.  Get at least 30 minutes of exercise that causes your heart to beat faster (aerobic exercise) most days of the week. Activities may include walking, swimming, or biking.  Work with your health care provider or diet and nutrition specialist (dietitian) to adjust your eating plan to your individual calorie needs. Reading food labels  Check food labels for the amount of sodium per serving. Choose foods with less than 5 percent of the Daily Value of sodium. Generally, foods with less than 300 mg of sodium per serving fit into this eating plan.  To find whole grains, look for the word "whole" as the first word in the ingredient list. Shopping  Buy products labeled as "low-sodium" or "no salt added."  Buy fresh foods. Avoid canned foods and premade or frozen meals. Cooking  Avoid adding salt when cooking. Use salt-free seasonings or herbs instead of table salt or sea salt. Check with your health care provider or pharmacist before using salt substitutes.  Do not fry foods. Cook foods using healthy methods such as baking, boiling, grilling, and broiling instead.  Cook with  heart-healthy oils, such as olive, canola, soybean, or sunflower oil. Meal planning   Eat a balanced diet that includes: ? 5 or more servings of fruits and vegetables each day. At each meal, try to fill half of your plate with fruits and vegetables. ? Up to 6-8 servings of whole grains each day. ? Less than 6 oz of lean meat, poultry, or fish each day. A 3-oz serving of meat is about the same size as a deck of cards. One egg equals 1 oz. ? 2 servings of low-fat dairy each day. ? A serving of nuts, seeds, or beans 5 times each week. ? Heart-healthy fats. Healthy fats called Omega-3 fatty acids are found in foods such as flaxseeds and coldwater fish, like sardines, salmon, and mackerel.  Limit how much you eat of the following: ? Canned or prepackaged foods. ? Food that is high in trans fat, such as fried foods. ? Food that is high in saturated fat, such as fatty meat. ? Sweets, desserts, sugary drinks, and other foods with added sugar. ? Full-fat dairy products.  Do not salt foods before eating.  Try to eat at least 2 vegetarian meals each week.  Eat more home-cooked food and less restaurant, buffet, and fast food.  When eating at a restaurant, ask that your food be prepared with less salt or no salt, if possible. What foods are recommended? The items listed may not be a complete list. Talk with your dietitian about what   dietary choices are best for you. Grains Whole-grain or whole-wheat bread. Whole-grain or whole-wheat pasta. Brown rice. Oatmeal. Quinoa. Bulgur. Whole-grain and low-sodium cereals. Pita bread. Low-fat, low-sodium crackers. Whole-wheat flour tortillas. Vegetables Fresh or frozen vegetables (raw, steamed, roasted, or grilled). Low-sodium or reduced-sodium tomato and vegetable juice. Low-sodium or reduced-sodium tomato sauce and tomato paste. Low-sodium or reduced-sodium canned vegetables. Fruits All fresh, dried, or frozen fruit. Canned fruit in natural juice (without  added sugar). Meat and other protein foods Skinless chicken or turkey. Ground chicken or turkey. Pork with fat trimmed off. Fish and seafood. Egg whites. Dried beans, peas, or lentils. Unsalted nuts, nut butters, and seeds. Unsalted canned beans. Lean cuts of beef with fat trimmed off. Low-sodium, lean deli meat. Dairy Low-fat (1%) or fat-free (skim) milk. Fat-free, low-fat, or reduced-fat cheeses. Nonfat, low-sodium ricotta or cottage cheese. Low-fat or nonfat yogurt. Low-fat, low-sodium cheese. Fats and oils Soft margarine without trans fats. Vegetable oil. Low-fat, reduced-fat, or light mayonnaise and salad dressings (reduced-sodium). Canola, safflower, olive, soybean, and sunflower oils. Avocado. Seasoning and other foods Herbs. Spices. Seasoning mixes without salt. Unsalted popcorn and pretzels. Fat-free sweets. What foods are not recommended? The items listed may not be a complete list. Talk with your dietitian about what dietary choices are best for you. Grains Baked goods made with fat, such as croissants, muffins, or some breads. Dry pasta or rice meal packs. Vegetables Creamed or fried vegetables. Vegetables in a cheese sauce. Regular canned vegetables (not low-sodium or reduced-sodium). Regular canned tomato sauce and paste (not low-sodium or reduced-sodium). Regular tomato and vegetable juice (not low-sodium or reduced-sodium). Pickles. Olives. Fruits Canned fruit in a light or heavy syrup. Fried fruit. Fruit in cream or butter sauce. Meat and other protein foods Fatty cuts of meat. Ribs. Fried meat. Bacon. Sausage. Bologna and other processed lunch meats. Salami. Fatback. Hotdogs. Bratwurst. Salted nuts and seeds. Canned beans with added salt. Canned or smoked fish. Whole eggs or egg yolks. Chicken or turkey with skin. Dairy Whole or 2% milk, cream, and half-and-half. Whole or full-fat cream cheese. Whole-fat or sweetened yogurt. Full-fat cheese. Nondairy creamers. Whipped toppings.  Processed cheese and cheese spreads. Fats and oils Butter. Stick margarine. Lard. Shortening. Ghee. Bacon fat. Tropical oils, such as coconut, palm kernel, or palm oil. Seasoning and other foods Salted popcorn and pretzels. Onion salt, garlic salt, seasoned salt, table salt, and sea salt. Worcestershire sauce. Tartar sauce. Barbecue sauce. Teriyaki sauce. Soy sauce, including reduced-sodium. Steak sauce. Canned and packaged gravies. Fish sauce. Oyster sauce. Cocktail sauce. Horseradish that you find on the shelf. Ketchup. Mustard. Meat flavorings and tenderizers. Bouillon cubes. Hot sauce and Tabasco sauce. Premade or packaged marinades. Premade or packaged taco seasonings. Relishes. Regular salad dressings. Where to find more information:  National Heart, Lung, and Blood Institute: www.nhlbi.nih.gov  American Heart Association: www.heart.org Summary  The DASH eating plan is a healthy eating plan that has been shown to reduce high blood pressure (hypertension). It may also reduce your risk for type 2 diabetes, heart disease, and stroke.  With the DASH eating plan, you should limit salt (sodium) intake to 2,300 mg a day. If you have hypertension, you may need to reduce your sodium intake to 1,500 mg a day.  When on the DASH eating plan, aim to eat more fresh fruits and vegetables, whole grains, lean proteins, low-fat dairy, and heart-healthy fats.  Work with your health care provider or diet and nutrition specialist (dietitian) to adjust your eating plan to your individual   calorie needs. This information is not intended to replace advice given to you by your health care provider. Make sure you discuss any questions you have with your health care provider. Document Released: 10/20/2011 Document Revised: 10/24/2016 Document Reviewed: 10/24/2016 Elsevier Interactive Patient Education  2018 [employer]  Exercising to Lose Weight Exercising can help you to lose weight. In order to lose weight  through exercise, you need to do vigorous-intensity exercise. You can tell that you are exercising with vigorous intensity if you are breathing very hard and fast and cannot hold a conversation while exercising. Moderate-intensity exercise helps to maintain your current weight. You can tell that you are exercising at a moderate level if you have a higher heart rate and faster breathing, but you are still able to hold a conversation. How often should I exercise? Choose an activity that you enjoy and set realistic goals. Your health care provider can help you to make an activity plan that works for you. Exercise regularly as directed by your health care provider. This may include:  Doing resistance training twice each week, such as: ? Push-ups. ? Sit-ups. ? Lifting weights. ? Using resistance bands.  Doing a given intensity of exercise for a given amount of time. Choose from these options: ? 150 minutes of moderate-intensity exercise every week. ? 75 minutes of vigorous-intensity exercise every week. ? A mix of moderate-intensity and vigorous-intensity exercise every week.  Children, pregnant women, people who are out of shape, people who are overweight, and older adults may need to consult a health care provider for individual recommendations. If you have any sort of medical condition, be sure to consult your health care provider before starting a new exercise program. What are some activities that can help me to lose weight?  Walking at a rate of at least 4.5 miles an hour.  Jogging or running at a rate of 5 miles per hour.  Biking at a rate of at least 10 miles per hour.  Lap swimming.  Roller-skating or in-line skating.  Cross-country skiing.  Vigorous competitive sports, such as football, basketball, and soccer.  Jumping rope.  Aerobic dancing. How can I be more active in my day-to-day activities?  Use the stairs instead of the elevator.  Take a walk during your lunch  break.  If you drive, park your car farther away from work or school.  If you take public transportation, get off one stop early and walk the rest of the way.  Make all of your phone calls while standing up and walking around.  Get up, stretch, and walk around every 30 minutes throughout the day. What guidelines should I follow while exercising?  Do not exercise so much that you hurt yourself, feel dizzy, or get very short of breath.  Consult your health care provider prior to starting a new exercise program.  Wear comfortable clothes and shoes with good support.  Drink plenty of water while you exercise to prevent dehydration or heat stroke. Body water is lost during exercise and must be replaced.  Work out until you breathe faster and your heart beats faster. This information is not intended to replace advice given to you by your health care provider. Make sure you discuss any questions you have with your health care provider. Document Released: 12/03/2010 Document Revised: 04/07/2016 Document Reviewed: 04/03/2014 Elsevier Interactive Patient Education  2018 [employer]  

## 2018-09-27 ENCOUNTER — Other Ambulatory Visit: Payer: Self-pay | Admitting: Family Medicine

## 2018-09-27 DIAGNOSIS — J209 Acute bronchitis, unspecified: Secondary | ICD-10-CM

## 2018-11-01 ENCOUNTER — Ambulatory Visit: Payer: Managed Care, Other (non HMO) | Admitting: Family Medicine

## 2018-11-22 ENCOUNTER — Ambulatory Visit: Payer: Managed Care, Other (non HMO) | Admitting: Family Medicine

## 2018-11-29 ENCOUNTER — Encounter: Payer: Self-pay | Admitting: Family Medicine

## 2018-11-29 ENCOUNTER — Ambulatory Visit: Payer: Managed Care, Other (non HMO) | Admitting: Family Medicine

## 2018-11-29 ENCOUNTER — Ambulatory Visit (INDEPENDENT_AMBULATORY_CARE_PROVIDER_SITE_OTHER): Payer: Managed Care, Other (non HMO)

## 2018-11-29 VITALS — BP 138/82 | HR 93 | Ht 67.0 in | Wt 243.4 lb

## 2018-11-29 DIAGNOSIS — I10 Essential (primary) hypertension: Secondary | ICD-10-CM | POA: Diagnosis not present

## 2018-11-29 DIAGNOSIS — Z23 Encounter for immunization: Secondary | ICD-10-CM

## 2018-11-29 DIAGNOSIS — M722 Plantar fascial fibromatosis: Secondary | ICD-10-CM

## 2018-11-29 DIAGNOSIS — H6123 Impacted cerumen, bilateral: Secondary | ICD-10-CM | POA: Diagnosis not present

## 2018-11-29 MED ORDER — AMLODIPINE BESYLATE 5 MG PO TABS: 5.0000 mg | ORAL_TABLET | Freq: Every day | ORAL | 1 refills | Status: DC

## 2018-11-29 MED ORDER — ETODOLAC 300 MG PO CAPS: 300.0000 mg | ORAL_CAPSULE | Freq: Three times a day (TID) | ORAL | 1 refills | Status: DC

## 2018-11-29 MED ORDER — EAR WAX CLEANSING 6.5 % OT KIT: PACK | OTIC | 5 refills | Status: DC

## 2018-11-29 NOTE — Patient Instructions (Signed)
Earwax Buildup, Adult The ears produce a substance called earwax that helps keep bacteria out of the ear and protects the skin in the ear canal. Occasionally, earwax can build up in the ear and cause discomfort or hearing loss. What increases the risk? This condition is more likely to develop in people who:  Are male.  Are elderly.  Naturally produce more earwax.  Clean their ears often with cotton swabs.  Use earplugs often.  Use in-ear headphones often.  Wear hearing aids.  Have narrow ear canals.  Have earwax that is overly thick or sticky.  Have eczema.  Are dehydrated.  Have excess hair in the ear canal. What are the signs or symptoms? Symptoms of this condition include:  Reduced or muffled hearing.  A feeling of fullness in the ear or feeling that the ear is plugged.  Fluid coming from the ear.  Ear pain.  Ear itch.  Ringing in the ear.  Coughing.  An obvious piece of earwax that can be seen inside the ear canal. How is this diagnosed? This condition may be diagnosed based on:  Your symptoms.  Your medical history.  An ear exam. During the exam, your health care provider will look into your ear with an instrument called an otoscope. You may have tests, including a hearing test. How is this treated? This condition may be treated by:  Using ear drops to soften the earwax.  Having the earwax removed by a health care provider. The health care provider may: ? Flush the ear with water. ? Use an instrument that has a loop on the end (curette). ? Use a suction device.  Surgery to remove the wax buildup. This may be done in severe cases. Follow these instructions at home:   Take over-the-counter and prescription medicines only as told by your health care provider.  Do not put any objects, including cotton swabs, into your ear. You can clean the opening of your ear canal with a washcloth or facial tissue.  Follow instructions from your health care  provider about cleaning your ears. Do not over-clean your ears.  Drink enough fluid to keep your urine clear or pale yellow. This will help to thin the earwax.  Keep all follow-up visits as told by your health care provider. If earwax builds up in your ears often or if you use hearing aids, consider seeing your health care provider for routine, preventive ear cleanings. Ask your health care provider how often you should schedule your cleanings.  If you have hearing aids, clean them according to instructions from the manufacturer and your health care provider. Contact a health care provider if:  You have ear pain.  You develop a fever.  You have blood, pus, or other fluid coming from your ear.  You have hearing loss.  You have ringing in your ears that does not go away.  Your symptoms do not improve with treatment.  You feel like the room is spinning (vertigo). Summary  Earwax can build up in the ear and cause discomfort or hearing loss.  The most common symptoms of this condition include reduced or muffled hearing and a feeling of fullness in the ear or feeling that the ear is plugged.  This condition may be diagnosed based on your symptoms, your medical history, and an ear exam.  This condition may be treated by using ear drops to soften the earwax or by having the earwax removed by a health care provider.  Do not put any   objects, including cotton swabs, into your ear. You can clean the opening of your ear canal with a washcloth or facial tissue. This information is not intended to replace advice given to you by your health care provider. Make sure you discuss any questions you have with your health care provider. Document Released: 12/08/2004 Document Revised: 10/12/2017 Document Reviewed: 01/11/2017 Elsevier Interactive Patient Education  2019 [employer]  Plantar Fasciitis  Plantar fasciitis is a painful foot condition that affects the heel. It occurs when the band of  tissue that connects the toes to the heel bone (plantar fascia) becomes irritated. This can happen as the result of exercising too much or doing other repetitive activities (overuse injury). The pain from plantar fasciitis can range from mild irritation to severe pain that makes it difficult to walk or move. The pain is usually worse in the morning after sleeping, or after sitting or lying down for a while. Pain may also be worse after long periods of walking or standing. What are the causes? This condition may be caused by:  Standing for long periods of time.  Wearing shoes that do not have good arch support.  Doing activities that put stress on joints (high-impact activities), including running, aerobics, and ballet.  Being overweight.  An abnormal way of walking (gait).  Tight muscles in the back of your lower leg (calf).  High arches in your feet.  Starting a new athletic activity. What are the signs or symptoms? The main symptom of this condition is heel pain. Pain may:  Be worse with first steps after a time of rest, especially in the morning after sleeping or after you have been sitting or lying down for a while.  Be worse after long periods of standing still.  Decrease after 30-45 minutes of activity, such as gentle walking. How is this diagnosed? This condition may be diagnosed based on your medical history and your symptoms. Your health care provider may ask questions about your activity level. Your health care provider will do a physical exam to check for:  A tender area on the bottom of your foot.  A high arch in your foot.  Pain when you move your foot.  Difficulty moving your foot. You may have imaging tests to confirm the diagnosis, such as:  X-rays.  Ultrasound.  MRI. How is this treated? Treatment for plantar fasciitis depends on how severe your condition is. Treatment may include:  Rest, ice, applying pressure (compression), and raising the affected  foot (elevation). This may be called RICE therapy. Your health care provider may recommend RICE therapy along with over-the-counter pain medicines to manage your pain.  Exercises to stretch your calves and your plantar fascia.  A splint that holds your foot in a stretched, upward position while you sleep (night splint).  Physical therapy to relieve symptoms and prevent problems in the future.  Injections of steroid medicine (cortisone) to relieve pain and inflammation.  Stimulating your plantar fascia with electrical impulses (extracorporeal shock wave therapy). This is usually the last treatment option before surgery.  Surgery, if other treatments have not worked after 12 months. Follow these instructions at home:  Managing pain, stiffness, and swelling  If directed, put ice on the painful area: ? Put ice in a plastic bag, or use a frozen bottle of water. ? Place a towel between your skin and the bag or bottle. ? Roll the bottom of your foot over the bag or bottle. ? Do this for 20 minutes, 2-3  times a day.  Wear athletic shoes that have air-sole or gel-sole cushions, or try wearing soft shoe inserts that are designed for plantar fasciitis.  Raise (elevate) your foot above the level of your heart while you are sitting or lying down. Activity  Avoid activities that cause pain. Ask your health care provider what activities are safe for you.  Do physical therapy exercises and stretches as told by your health care provider.  Try activities and forms of exercise that are easier on your joints (low-impact). Examples include swimming, water aerobics, and biking. General instructions  Take over-the-counter and prescription medicines only as told by your health care provider.  Wear a night splint while sleeping, if told by your health care provider. Loosen the splint if your toes tingle, become numb, or turn cold and blue.  Maintain a healthy weight, or work with your health care  provider to lose weight as needed.  Keep all follow-up visits as told by your health care provider. This is important. Contact a health care provider if you:  Have symptoms that do not go away after caring for yourself at home.  Have pain that gets worse.  Have pain that affects your ability to move or do your daily activities. Summary  Plantar fasciitis is a painful foot condition that affects the heel. It occurs when the band of tissue that connects the toes to the heel bone (plantar fascia) becomes irritated.  The main symptom of this condition is heel pain that may be worse after exercising too much or standing still for a long time.  Treatment varies, but it usually starts with rest, ice, compression, and elevation (RICE therapy) and over-the-counter medicines to manage pain. This information is not intended to replace advice given to you by your health care provider. Make sure you discuss any questions you have with your health care provider. Document Released: 07/26/2001 Document Revised: 08/28/2017 Document Reviewed: 08/28/2017 Elsevier Interactive Patient Education  2019 [place] DASH stands for "Dietary Approaches to Stop Hypertension." The DASH eating plan is a healthy eating plan that has been shown to reduce high blood pressure (hypertension). It may also reduce your risk for type 2 diabetes, heart disease, and stroke. The DASH eating plan may also help with weight loss. What are tips for following this plan?  General guidelines  Avoid eating more than 2,300 mg (milligrams) of salt (sodium) a day. If you have hypertension, you may need to reduce your sodium intake to 1,500 mg a day.  Limit alcohol intake to no more than 1 drink a day for nonpregnant women and 2 drinks a day for men. One drink equals 12 oz of beer, 5 oz of wine, or 1 oz of hard liquor.  Work with your health care provider to maintain a healthy body weight or to lose weight. Ask what  an ideal weight is for you.  Get at least 30 minutes of exercise that causes your heart to beat faster (aerobic exercise) most days of the week. Activities may include walking, swimming, or biking.  Work with your health care provider or diet and nutrition specialist (dietitian) to adjust your eating plan to your individual calorie needs. Reading food labels   Check food labels for the amount of sodium per serving. Choose foods with less than 5 percent of the Daily Value of sodium. Generally, foods with less than 300 mg of sodium per serving fit into this eating plan.  To find whole grains, look  for the word "whole" as the first word in the ingredient list. Shopping  Buy products labeled as "low-sodium" or "no salt added."  Buy fresh foods. Avoid canned foods and premade or frozen meals. Cooking  Avoid adding salt when cooking. Use salt-free seasonings or herbs instead of table salt or sea salt. Check with your health care provider or pharmacist before using salt substitutes.  Do not fry foods. Cook foods using healthy methods such as baking, boiling, grilling, and broiling instead.  Cook with heart-healthy oils, such as olive, canola, soybean, or sunflower oil. Meal planning  Eat a balanced diet that includes: ? 5 or more servings of fruits and vegetables each day. At each meal, try to fill half of your plate with fruits and vegetables. ? Up to 6-8 servings of whole grains each day. ? Less than 6 oz of lean meat, poultry, or fish each day. A 3-oz serving of meat is about the same size as a deck of cards. One egg equals 1 oz. ? 2 servings of low-fat dairy each day. ? A serving of nuts, seeds, or beans 5 times each week. ? Heart-healthy fats. Healthy fats called Omega-3 fatty acids are found in foods such as flaxseeds and coldwater fish, like sardines, salmon, and mackerel.  Limit how much you eat of the following: ? Canned or prepackaged foods. ? Food that is high in trans fat,  such as fried foods. ? Food that is high in saturated fat, such as fatty meat. ? Sweets, desserts, sugary drinks, and other foods with added sugar. ? Full-fat dairy products.  Do not salt foods before eating.  Try to eat at least 2 vegetarian meals each week.  Eat more home-cooked food and less restaurant, buffet, and fast food.  When eating at a restaurant, ask that your food be prepared with less salt or no salt, if possible. What foods are recommended? The items listed may not be a complete list. Talk with your dietitian about what dietary choices are best for you. Grains Whole-grain or whole-wheat bread. Whole-grain or whole-wheat pasta. Brown rice. [place]. Bulgur. Whole-grain and low-sodium cereals. Pita bread. Low-fat, low-sodium crackers. Whole-wheat flour tortillas. Vegetables Fresh or frozen vegetables (raw, steamed, roasted, or grilled). Low-sodium or reduced-sodium tomato and vegetable juice. Low-sodium or reduced-sodium tomato sauce and tomato paste. Low-sodium or reduced-sodium canned vegetables. Fruits All fresh, dried, or frozen fruit. Canned fruit in natural juice (without added sugar). Meat and other protein foods Skinless chicken or [place]. Ground chicken or [place]. Pork with fat trimmed off. Fish and seafood. Egg whites. Dried beans, peas, or lentils. Unsalted nuts, nut butters, and seeds. Unsalted canned beans. Lean cuts of beef with fat trimmed off. Low-sodium, lean deli meat. Dairy Low-fat (1%) or fat-free (skim) milk. Fat-free, low-fat, or reduced-fat cheeses. Nonfat, low-sodium ricotta or cottage cheese. Low-fat or nonfat yogurt. Low-fat, low-sodium cheese. Fats and oils Soft margarine without trans fats. Vegetable oil. Low-fat, reduced-fat, or light mayonnaise and salad dressings (reduced-sodium). Canola, safflower, olive, soybean, and sunflower oils. Avocado. Seasoning and other foods Herbs. Spices. Seasoning mixes without salt. Unsalted popcorn and  pretzels. Fat-free sweets. What foods are not recommended? The items listed may not be a complete list. Talk with your dietitian about what dietary choices are best for you. Grains Baked goods made with fat, such as croissants, muffins, or some breads. Dry pasta or rice meal packs. Vegetables Creamed or fried vegetables. Vegetables in a cheese sauce. Regular canned vegetables (not low-sodium or reduced-sodium). Regular canned  tomato sauce and paste (not low-sodium or reduced-sodium). Regular tomato and vegetable juice (not low-sodium or reduced-sodium). Angie Fava. Olives. Fruits Canned fruit in a light or heavy syrup. Fried fruit. Fruit in cream or butter sauce. Meat and other protein foods Fatty cuts of meat. Ribs. Fried meat. Berniece Salines. Sausage. Bologna and other processed lunch meats. Salami. Fatback. Hotdogs. Bratwurst. Salted nuts and seeds. Canned beans with added salt. Canned or smoked fish. Whole eggs or egg yolks. Chicken or [place] with skin. Dairy Whole or 2% milk, cream, and half-and-half. Whole or full-fat cream cheese. Whole-fat or sweetened yogurt. Full-fat cheese. Nondairy creamers. Whipped toppings. Processed cheese and cheese spreads. Fats and oils Butter. Stick margarine. Lard. Shortening. Ghee. Bacon fat. Tropical oils, such as coconut, palm kernel, or palm oil. Seasoning and other foods Salted popcorn and pretzels. Onion salt, garlic salt, seasoned salt, table salt, and sea salt. Worcestershire sauce. Tartar sauce. Barbecue sauce. Teriyaki sauce. Soy sauce, including reduced-sodium. Steak sauce. Canned and packaged gravies. Fish sauce. Oyster sauce. Cocktail sauce. Horseradish that you find on the shelf. Ketchup. Mustard. Meat flavorings and tenderizers. Bouillon cubes. Hot sauce and Tabasco sauce. Premade or packaged marinades. Premade or packaged taco seasonings. Relishes. Regular salad dressings. Where to find more information:  National Heart, Lung, and [place]:  https://wilson-eaton.com/  American Heart Association: www.heart.org Summary  The DASH eating plan is a healthy eating plan that has been shown to reduce high blood pressure (hypertension). It may also reduce your risk for type 2 diabetes, heart disease, and stroke.  With the DASH eating plan, you should limit salt (sodium) intake to 2,300 mg a day. If you have hypertension, you may need to reduce your sodium intake to 1,500 mg a day.  When on the DASH eating plan, aim to eat more fresh fruits and vegetables, whole grains, lean proteins, low-fat dairy, and heart-healthy fats.  Work with your health care provider or diet and nutrition specialist (dietitian) to adjust your eating plan to your individual calorie needs. This information is not intended to replace advice given to you by your health care provider. Make sure you discuss any questions you have with your health care provider. Document Released: 10/20/2011 Document Revised: 10/24/2016 Document Reviewed: 10/24/2016 Elsevier Interactive Patient Education  2019 [employer].

## 2018-11-29 NOTE — Progress Notes (Signed)
Established Patient Office Visit  Subjective:  Patient ID: Isaiah Rice, male    DOB: December 09, 1955  Age: 63 y.o. MRN: [id number]  CC:  Chief Complaint  Patient presents with  . Follow-up    HPI Isaiah Rice presents for follow up on his blood pressure. He is taking his medications as directed.  Blood pressure has been controlled but he is running slightly higher.  He has gained some weight.  His mother died before Christmas.  He has had a tough time making it to the gym and there has been increased availability of food.  He is also been having trouble with his left heel intermittently.  It is usually worse after he is slapped or been inactive for period of time for step is the worst.  Past Medical History:  Diagnosis Date  . Allergy, unspecified not elsewhere classified   . Anxiety   . DJD (degenerative joint disease)   . Esophageal reflux   . Hypertension     Past Surgical History:  Procedure Laterality Date  . HERNIA REPAIR      Family History  Problem Relation Age of Onset  . Lung cancer Father 6  . Other Mother        DJD and TKR  . Breast cancer Sister     Social History   Socioeconomic History  . Marital status: Divorced    Spouse name: Not on file  . Number of children: 1  . Years of education: Not on file  . Highest education level: Not on file  Occupational History  . Not on file  Social Needs  . Financial resource strain: Not on file  . Food insecurity:    Worry: Not on file    Inability: Not on file  . Transportation needs:    Medical: Not on file    Non-medical: Not on file  Tobacco Use  . Smoking status: Current Some Day Smoker    Types: Cigars  . Smokeless tobacco: Never Used  . Tobacco comment: did smoke an occasional cigar once a year  Substance and Sexual Activity  . Alcohol use: Yes    Comment: social etoh  . Drug use: No  . Sexual activity: Not on file  Lifestyle  . Physical activity:    Days per week: Not on file   Minutes per session: Not on file  . Stress: Not on file  Relationships  . Social connections:    Talks on phone: Not on file    Gets together: Not on file    Attends religious service: Not on file    Active member of club or organization: Not on file    Attends meetings of clubs or organizations: Not on file    Relationship status: Not on file  . Intimate partner violence:    Fear of current or ex partner: Not on file    Emotionally abused: Not on file    Physically abused: Not on file    Forced sexual activity: Not on file  Other Topics Concern  . Not on file  Social History Narrative  . Not on file    Outpatient Medications Prior to Visit  Medication Sig Dispense Refill  . tadalafil (CIALIS) 20 MG tablet Take 0.5-1 tablets (10-20 mg total) by mouth every other day as needed for erectile dysfunction. 5 tablet 11  . amLODipine (NORVASC) 5 MG tablet Take 1 tablet (5 mg total) by mouth daily. 90 tablet 1  . amoxicillin (AMOXIL)  500 MG capsule Take 1 capsule (500 mg total) by mouth 3 (three) times daily. 30 capsule 0   No facility-administered medications prior to visit.     No Known Allergies  ROS Review of Systems  Constitutional: Negative.   HENT: Negative.   Eyes: Negative for photophobia and visual disturbance.  Respiratory: Negative.   Cardiovascular: Negative.   Gastrointestinal: Negative.   Endocrine: Negative for polyphagia and polyuria.  Genitourinary: Negative.   Musculoskeletal: Positive for arthralgias.  Skin: Negative for pallor and rash.  Allergic/Immunologic: Negative for immunocompromised state.  Neurological: Negative for light-headedness and headaches.  Psychiatric/Behavioral: Negative.       Objective:    Physical Exam  Constitutional: He is oriented to person, place, and time. He appears well-developed and well-nourished. No distress.  HENT:  Head: Normocephalic and atraumatic.  Right Ear: External ear normal. A foreign body is present.  Left  Ear: External ear normal. A foreign body is present.  Ears:  Mouth/Throat: Oropharynx is clear and moist. No oropharyngeal exudate.  Eyes: Pupils are equal, round, and reactive to light. Conjunctivae are normal. Right eye exhibits no discharge. Left eye exhibits no discharge. No scleral icterus.  Neck: Neck supple. No JVD present. No tracheal deviation present. No thyromegaly present.  Cardiovascular: Normal rate, regular rhythm and normal heart sounds.  Pulses:      Dorsalis pedis pulses are 2+ on the left side.       Posterior tibial pulses are 2+ on the left side.  Pulmonary/Chest: Effort normal and breath sounds normal.  Abdominal: Soft. Bowel sounds are normal.  Musculoskeletal:       Feet:  Lymphadenopathy:    He has no cervical adenopathy.  Neurological: He is alert and oriented to person, place, and time.  Skin: Skin is warm and dry. He is not diaphoretic.  Psychiatric: He has a normal mood and affect. His behavior is normal.    BP 138/82   Pulse 93   Ht _0  (1.702 m)   Wt 243 lb 6 oz (110.4 kg)   SpO2 96%   BMI 38.12 kg/m  Wt Readings from Last 3 Encounters:  11/29/18 243 lb 6 oz (110.4 kg)  05/03/18 223 lb 8 oz (101.4 kg)  03/22/18 234 lb (106.1 kg)   BP Readings from Last 3 Encounters:  11/29/18 138/82  05/03/18 (!) 140/92  03/22/18 (!) 142/90   Guideline developer:  UpToDate (see UpToDate for funding source) Date Released: June 2014  Health Maintenance Due  Topic Date Due  . Hepatitis C Screening  1956/09/26  . TETANUS/TDAP  02/18/1976    There are no preventive care reminders to display for this patient.  Lab Results  Component Value Date   TSH 1.93 03/22/2018   Lab Results  Component Value Date   WBC 6.8 03/22/2018   HGB 15.1 03/22/2018   HCT 46.3 03/22/2018   MCV 89.2 03/22/2018   PLT 263.0 03/22/2018   Lab Results  Component Value Date   NA 141 03/22/2018   K 4.3 03/22/2018   CO2 31 03/22/2018   GLUCOSE 103 (H) 03/22/2018   BUN 10  03/22/2018   CREATININE 0.96 03/22/2018   BILITOT 0.9 03/22/2018   ALKPHOS 60 03/22/2018   AST 19 03/22/2018   ALT 23 03/22/2018   PROT 7.2 03/22/2018   ALBUMIN 4.4 03/22/2018   CALCIUM 9.6 03/22/2018   GFR 102.38 03/22/2018   Lab Results  Component Value Date   CHOL 123 03/22/2018   Lab Results  Component Value Date   HDL 41.20 03/22/2018   Lab Results  Component Value Date   LDLCALC 68 03/22/2018   Lab Results  Component Value Date   TRIG 68.0 03/22/2018   Lab Results  Component Value Date   CHOLHDL 3 03/22/2018   No results found for: HGBA1C    Assessment & Plan:   Problem List Items Addressed This Visit      Cardiovascular and Mediastinum   Essential hypertension - Primary   Relevant Medications   amLODipine (NORVASC) 5 MG tablet     Nervous and Auditory   Ceruminosis, bilateral   Relevant Medications   Carbamide Peroxide-Saline (EAR WAX CLEANSING) 6.5 % KIT     Musculoskeletal and Integument   Plantar fasciitis   Relevant Medications   etodolac (LODINE) 300 MG capsule   Other Relevant Orders   DG Foot Complete Left   Ambulatory referral to Sports Medicine     Other   Need for immunization against influenza   Relevant Orders   Flu Vaccine QUAD 36+ mos IM (Completed)      Meds ordered this encounter  Medications  . Carbamide Peroxide-Saline (EAR WAX CLEANSING) 6.5 % KIT    Sig: As per kit    Dispense:  1 kit    Refill:  5  . amLODipine (NORVASC) 5 MG tablet    Sig: Take 1 tablet (5 mg total) by mouth daily.    Dispense:  90 tablet    Refill:  1  . etodolac (LODINE) 300 MG capsule    Sig: Take 1 capsule (300 mg total) by mouth 3 (three) times daily. As needed.    Dispense:  60 capsule    Refill:  1    Follow-up: Return in about 6 months (around 05/30/2019).   Patient encouraged to get back to the gym and lose weight.  This would help his blood pressure and his plantar fasciitis.  He was given information on the DASH diet as well as  information on plantar fasciitis and a referral to sports medicine for consideration of inserts.  Advised him to change his footwear.  Given information on ceruminosis.  He will try to remove the wax himself.  He can do that for him in the dedicated appointment.

## 2019-01-03 ENCOUNTER — Encounter: Payer: Self-pay | Admitting: Family Medicine

## 2019-01-03 ENCOUNTER — Ambulatory Visit (INDEPENDENT_AMBULATORY_CARE_PROVIDER_SITE_OTHER): Payer: Managed Care, Other (non HMO)

## 2019-01-03 ENCOUNTER — Ambulatory Visit: Payer: Managed Care, Other (non HMO) | Admitting: Family Medicine

## 2019-01-03 VITALS — BP 146/80 | HR 76 | Temp 98.2°F | Ht 67.0 in

## 2019-01-03 DIAGNOSIS — M722 Plantar fascial fibromatosis: Secondary | ICD-10-CM | POA: Diagnosis not present

## 2019-01-03 NOTE — Assessment & Plan Note (Signed)
Symptoms seem most consistent with plantar fasciitis.  Has some spurring of the calcaneus at the origin as well as swelling of the plantar fascia at the origin. -Injection. -Counseled on home exercise therapy and supportive care. -If no improvement can consider custom orthotics or physical therapy.

## 2019-01-03 NOTE — Progress Notes (Signed)
Isaiah Rice - 63 y.o. male MRN [id number]  Date of birth: Dec 13, 1955  SUBJECTIVE:  Including CC & ROS.  Chief Complaint  Patient presents with  . Foot Pain    pt here for left foot pain in the heel. pt thinks he possibly may have plantar fascitis     Isaiah Rice is a 63 y.o. male that is presenting with left foot heel pain.  The pain is been occurring for 3 months.  It is acutely worsening.  Has not improved with modalities to date.  Has not tried any medications.  Pain is worse with the first few steps in the morning.  Is localized to the plantar aspect of the heel.  No inciting event or history of similar pain.  Pain is mild to moderate.  Seems improved after has been walking for a bit.  Pain is sharp and like he is walking on glass..  Independent review of the left foot x-ray from 1/16 mild changes at the origin of the plantar fascia.   Review of Systems  Constitutional: Negative for fever.  HENT: Negative for congestion.   Respiratory: Negative for cough.   Cardiovascular: Negative for chest pain.  Gastrointestinal: Negative for abdominal pain.  Musculoskeletal: Negative for gait problem.  Skin: Negative for color change.  Neurological: Negative for weakness.  Hematological: Negative for adenopathy.  Psychiatric/Behavioral: Negative for agitation.    HISTORY: Past Medical, Surgical, Social, and Family History Reviewed & Updated per EMR.   Pertinent Historical Findings include:  Past Medical History:  Diagnosis Date  . Allergy, unspecified not elsewhere classified   . Anxiety   . DJD (degenerative joint disease)   . Esophageal reflux   . Hypertension     Past Surgical History:  Procedure Laterality Date  . HERNIA REPAIR      No Known Allergies  Family History  Problem Relation Age of Onset  . Lung cancer Father 66  . Other Mother        DJD and TKR  . Breast cancer Sister      Social History   Socioeconomic History  . Marital status: Divorced    Spouse name: Not on file  . Number of children: 1  . Years of education: Not on file  . Highest education level: Not on file  Occupational History  . Not on file  Social Needs  . Financial resource strain: Not on file  . Food insecurity:    Worry: Not on file    Inability: Not on file  . Transportation needs:    Medical: Not on file    Non-medical: Not on file  Tobacco Use  . Smoking status: Current Some Day Smoker    Types: Cigars  . Smokeless tobacco: Never Used  . Tobacco comment: did smoke an occasional cigar once a year  Substance and Sexual Activity  . Alcohol use: Yes    Comment: social etoh  . Drug use: No  . Sexual activity: Not on file  Lifestyle  . Physical activity:    Days per week: Not on file    Minutes per session: Not on file  . Stress: Not on file  Relationships  . Social connections:    Talks on phone: Not on file    Gets together: Not on file    Attends religious service: Not on file    Active member of club or organization: Not on file    Attends meetings of clubs or organizations: Not on file  Relationship status: Not on file  . Intimate partner violence:    Fear of current or ex partner: Not on file    Emotionally abused: Not on file    Physically abused: Not on file    Forced sexual activity: Not on file  Other Topics Concern  . Not on file  Social History Narrative  . Not on file     PHYSICAL EXAM:  VS: BP (!) 146/80   Pulse 76   Temp 98.2 F (36.8 C) (Oral)   Ht 5\' 7"  (1.702 m)   SpO2 96%   BMI 38.12 kg/m  Physical Exam Gen: NAD, alert, cooperative with exam, well-appearing ENT: normal lips, normal nasal mucosa,  Eye: normal EOM, normal conjunctiva and lids CV:  no edema, +2 pedal pulses   Resp: no accessory muscle use, non-labored,  Skin: no rashes, no areas of induration  Neuro: normal tone, normal sensation to touch Psych:  normal insight, alert and oriented MSK:  Left foot: No ecchymosis or swelling. Tenderness to  palpation over the origin of the plantar fascia at the heel. Normal strength resistance. Some limitation with dorsiflexion. Fairly well-maintained longitudinal arch. Neurovascularly intact   Aspiration/Injection Procedure Note Isaiah Rice 01-Apr-1956  Procedure: Injection Indications: Left plantar fasciitis  Procedure Details Consent: Risks of procedure as well as the alternatives and risks of each were explained to the (patient/caregiver).  Consent for procedure obtained. Time Out: Verified patient identification, verified procedure, site/side was marked, verified correct patient position, special equipment/implants available, medications/allergies/relevent history reviewed, required imaging and test results available.  Performed.  The area was cleaned with iodine and alcohol swabs.    The left plantar fascia origin was injected using 1 cc's of 40 mg Depo-Medrol and 4 cc's of 0.25% bupivacaine with a 25 1 1/2" needle.  Ultrasound was used. Images were obtained in long views showing the injection.     A sterile dressing was applied.  Patient did tolerate procedure well.       ASSESSMENT & PLAN:   Plantar fasciitis Symptoms seem most consistent with plantar fasciitis.  Has some spurring of the calcaneus at the origin as well as swelling of the plantar fascia at the origin. -Injection. -Counseled on home exercise therapy and supportive care. -If no improvement can consider custom orthotics or physical therapy.

## 2019-01-03 NOTE — Patient Instructions (Signed)
Nice to meet you  Please try the exercises and stretches  Please try to ice  Please follow up if you would like custom orthotics made  Please see me back in 4-6 weeks if your pain isn't improving.

## 2019-02-01 ENCOUNTER — Encounter: Payer: Self-pay | Admitting: Gastroenterology

## 2019-02-19 IMAGING — DX DG FOOT COMPLETE 3+V*L*
3 series · 3 of 3 positions shown · non-contrast
Comparison: None.

CLINICAL DATA: Initial visit for left foot pain.  No injury.

EXAM:
LEFT FOOT - COMPLETE 3+ VIEW

[foot ap]
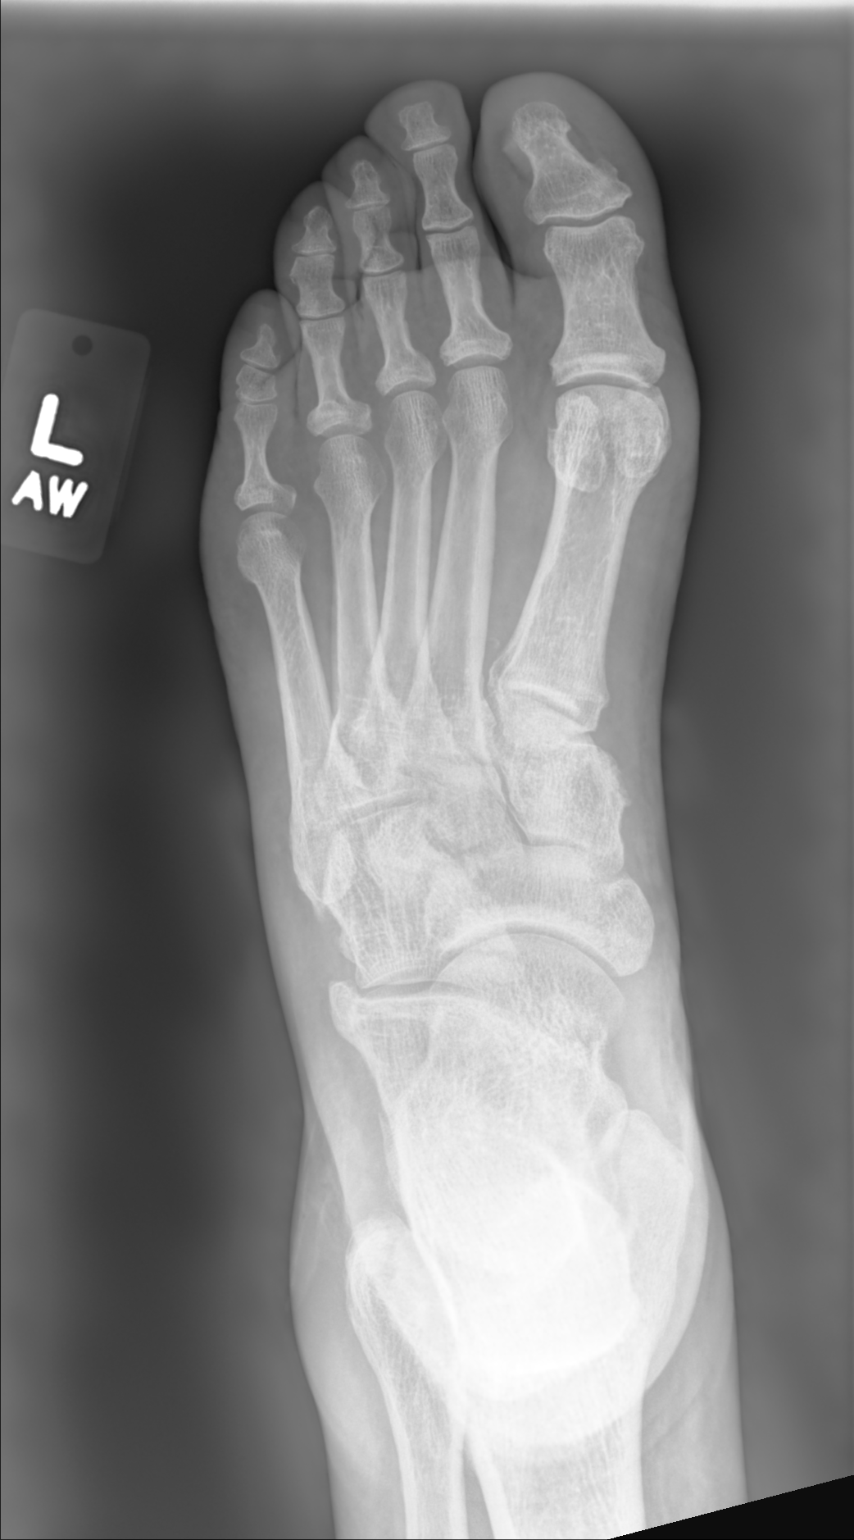

[foot mlo]
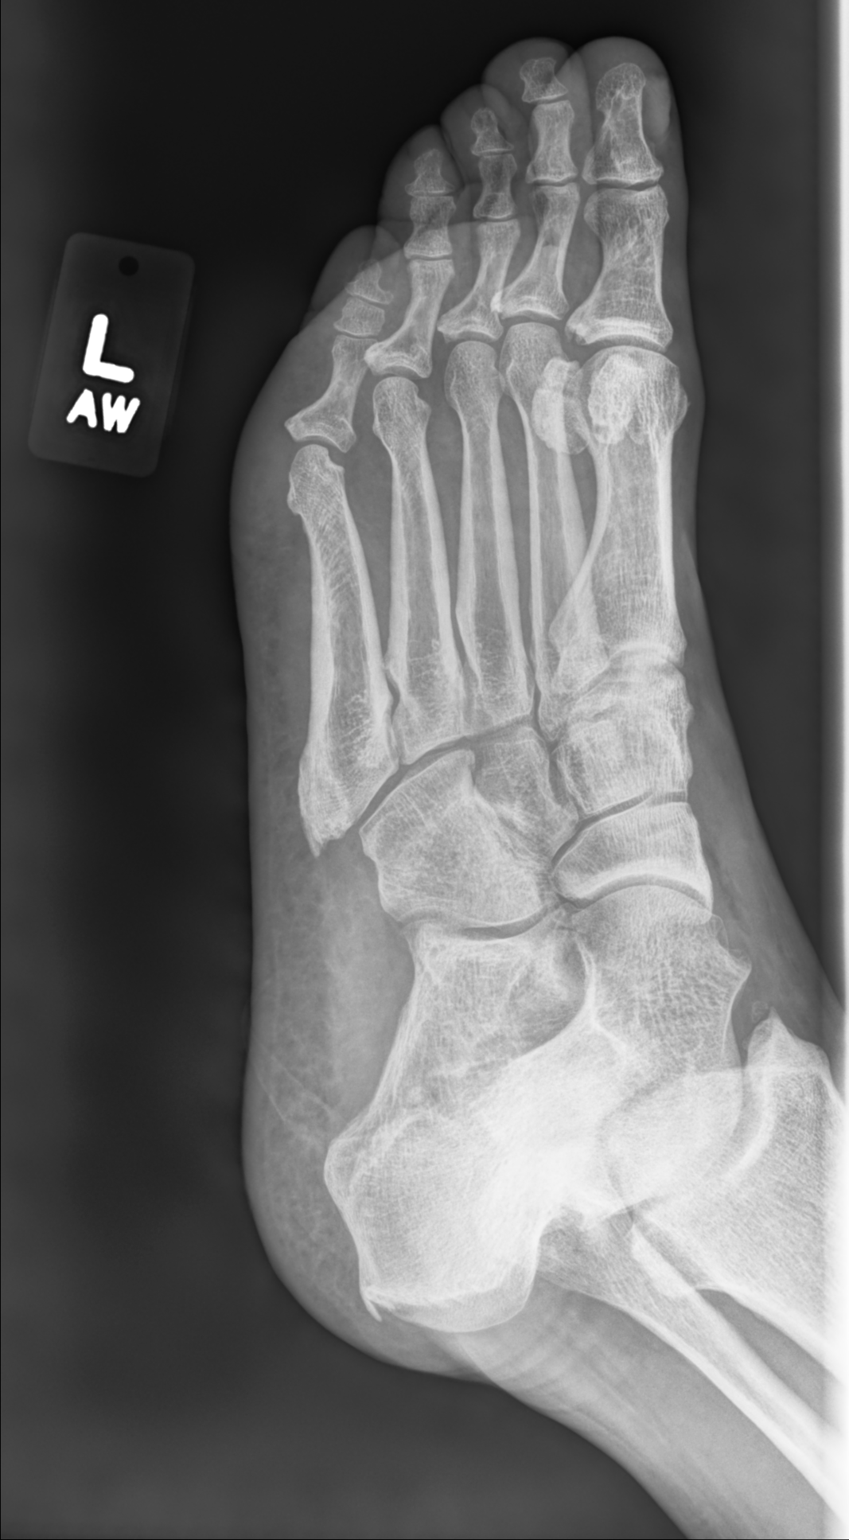

[foot lat]
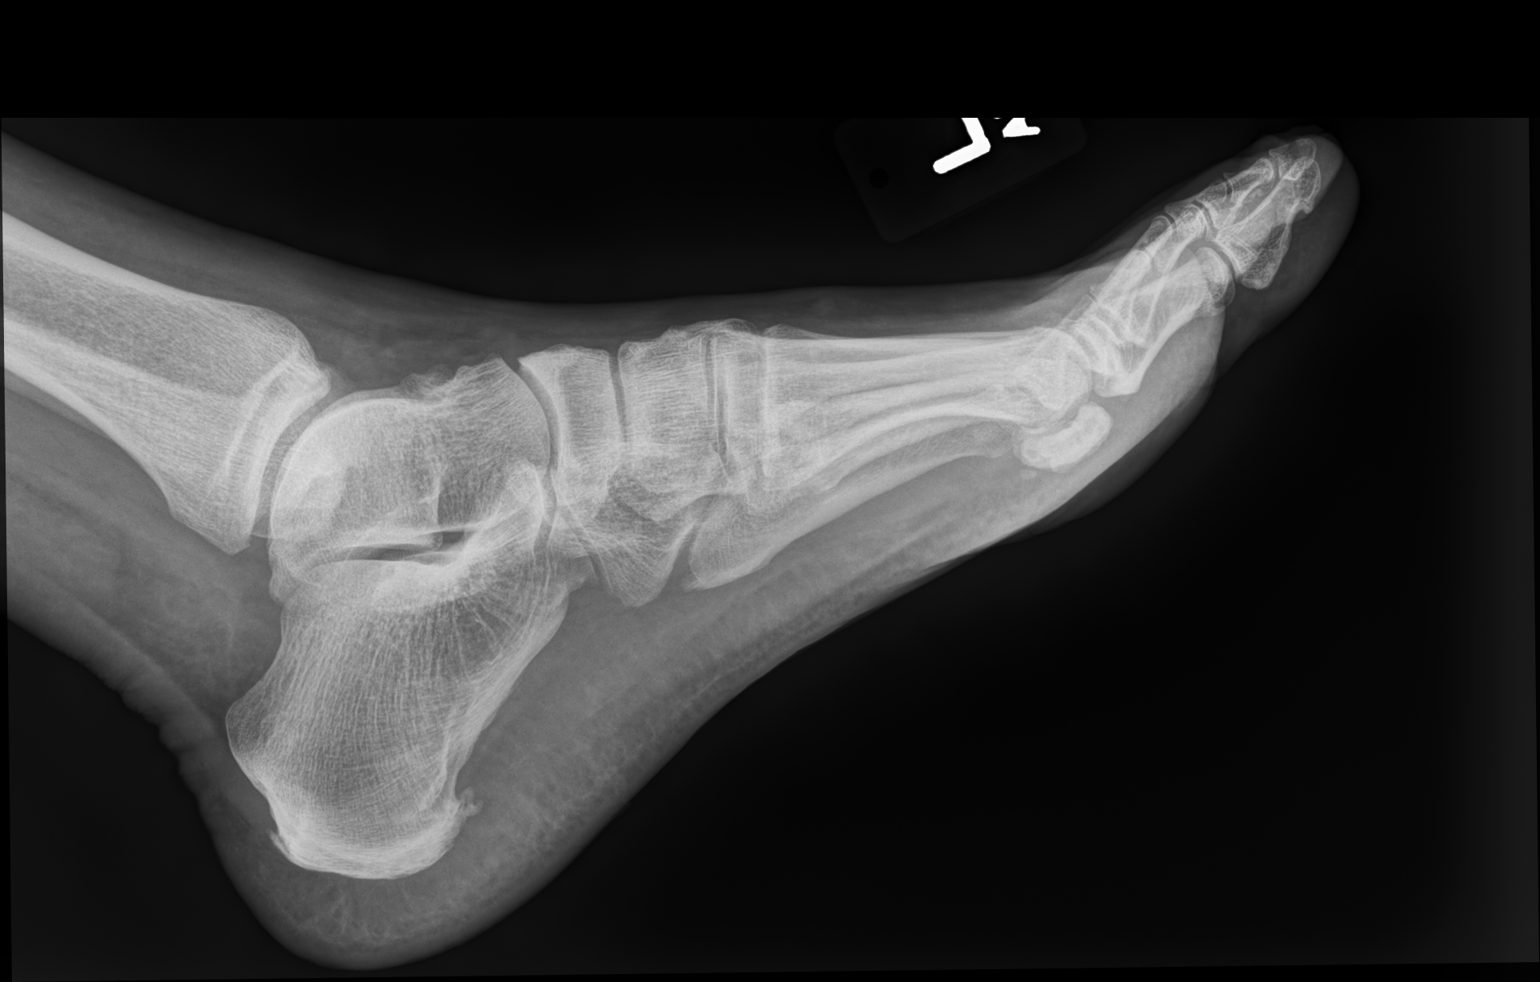

[3 of 3 positions shown; findings below may reference images not displayed]

FINDINGS: There is no evidence of fracture or dislocation. Plantar calcaneal
spur is noted. Degenerative joint changes of the tarsal bones are
identified. Soft tissues are unremarkable.
IMPRESSION: Degenerative joint changes of the left foot. No acute abnormality
noted.

## 2019-03-06 ENCOUNTER — Telehealth: Payer: Self-pay | Admitting: Family Medicine

## 2019-03-06 NOTE — Telephone Encounter (Signed)
Called and patient vm was full. Calling to schedule virtual visit with Ethelene Hal.

## 2020-07-05 ENCOUNTER — Other Ambulatory Visit: Payer: Self-pay | Admitting: Family Medicine

## 2020-07-05 DIAGNOSIS — I10 Essential (primary) hypertension: Secondary | ICD-10-CM

## 2020-08-04 NOTE — Telephone Encounter (Signed)
Called patient to schedule appointment for follow up, no answer unable to LM.

## 2020-10-09 ENCOUNTER — Other Ambulatory Visit: Payer: Self-pay | Admitting: Family Medicine

## 2020-10-09 DIAGNOSIS — I10 Essential (primary) hypertension: Secondary | ICD-10-CM

## 2021-02-10 ENCOUNTER — Other Ambulatory Visit: Payer: Self-pay | Admitting: Family

## 2021-02-10 DIAGNOSIS — I10 Essential (primary) hypertension: Secondary | ICD-10-CM

## 2021-07-04 ENCOUNTER — Other Ambulatory Visit: Payer: Self-pay

## 2021-07-04 ENCOUNTER — Ambulatory Visit (HOSPITAL_COMMUNITY)
Admission: EM | Admit: 2021-07-04 | Discharge: 2021-07-04 | Disposition: A | Discharge status: Home / Self Care | Payer: Managed Care, Other (non HMO) | Attending: Emergency Medicine | Admitting: Emergency Medicine

## 2021-07-04 ENCOUNTER — Encounter (HOSPITAL_COMMUNITY): Payer: Self-pay | Admitting: Emergency Medicine

## 2021-07-04 DIAGNOSIS — R109 Unspecified abdominal pain: Secondary | ICD-10-CM | POA: Insufficient documentation

## 2021-07-04 LAB — POCT URINALYSIS DIPSTICK, ED / UC
Bilirubin Urine: NEGATIVE
Glucose, UA: NEGATIVE mg/dL
Ketones, ur: NEGATIVE mg/dL
Leukocytes,Ua: NEGATIVE
Nitrite: NEGATIVE
Protein, ur: 30 mg/dL — AB
Specific Gravity, Urine: 1.025 (ref 1.005–1.030)
Urobilinogen, UA: 0.2 mg/dL (ref 0.0–1.0)
pH: 5.5 (ref 5.0–8.0)

## 2021-07-04 MED ORDER — TAMSULOSIN HCL 0.4 MG PO CAPS: 0.4000 mg | ORAL_CAPSULE | Freq: Every day | ORAL | 0 refills | Status: DC

## 2021-07-04 NOTE — ED Triage Notes (Signed)
Pt presents with right lower back pain xs 5 days.

## 2021-07-04 NOTE — ED Provider Notes (Signed)
[facility]   Chief Complaint  Patient presents with   Flank Pain     SUBJECTIVE:  Isaiah Rice is a 65 y.o. male who presents to the urgent care for complaint of right lower flank pain for the past 5 days.  He denies any precipitating event localizes the pain to the right flank.  Pain is intermittent described as aching character.  Has tried OTC medications without relief.  Symptoms are made worse with urination.  Admits to similar symptoms in the past.  Denies fever, chills, nausea, vomiting, abnormal penile discharge or bleeding.  LMP: No LMP for male patient.  ROS: As in HPI.  All other pertinent ROS negative.     Past Medical History:  Diagnosis Date   Allergy, unspecified not elsewhere classified    Anxiety    DJD (degenerative joint disease)    Esophageal reflux    Hypertension    Past Surgical History:  Procedure Laterality Date   HERNIA REPAIR     No Known Allergies No current facility-administered medications on file prior to encounter.   Current Outpatient Medications on File Prior to Encounter  Medication Sig Dispense Refill   amLODipine (NORVASC) 5 MG tablet TAKE 1 TABLET BY MOUTH EVERY DAY 30 tablet 2   Carbamide Peroxide-Saline (EAR WAX CLEANSING) 6.5 % KIT As per kit 1 kit 5   etodolac (LODINE) 300 MG capsule Take 1 capsule (300 mg total) by mouth 3 (three) times daily. As needed. 60 capsule 1   tadalafil (CIALIS) 20 MG tablet Take 0.5-1 tablets (10-20 mg total) by mouth every other day as needed for erectile dysfunction. 5 tablet 11   Social History   Socioeconomic History   Marital status: Divorced    Spouse name: Not on file   Number of children: 1   Years of education: Not on file   Highest education level: Not on file  Occupational History   Not on file  Tobacco Use   Smoking status: Some Days    Types: Cigars   Smokeless tobacco: Never   Tobacco comments:    did smoke an occasional cigar once a year  Substance and Sexual  Activity   Alcohol use: Yes    Comment: social etoh   Drug use: No   Sexual activity: Not on file  Other Topics Concern   Not on file  Social History Narrative   Not on file   Social Determinants of Health   Financial Resource Strain: Not on file  Food Insecurity: Not on file  Transportation Needs: Not on file  Physical Activity: Not on file  Stress: Not on file  Social Connections: Not on file  Intimate Partner Violence: Not on file   Family History  Problem Relation Age of Onset   Lung cancer Father 65   Other Mother        DJD and TKR   Breast cancer Sister     OBJECTIVE:  Vitals:   07/04/21 1457  BP: (!) 188/83  Pulse: 80  Resp: 17  Temp: 98.1 F (36.7 C)  TempSrc: Oral  SpO2: 96%   General appearance: AOx3 in no acute distress HEENT: NCAT.  Oropharynx clear.  Lungs: clear to auscultation bilaterally without adventitious breath sounds Heart: regular rate and rhythm.  Radial pulses 2+ symmetrical bilaterally Abdomen: soft; non-distended; no tenderness; bowel sounds present; no guarding or rebound tenderness Back: no CVA tenderness, right flank tenderness Extremities: no edema; symmetrical with no gross deformities Skin: warm and  dry Neurologic: Ambulates from chair to exam table without difficulty Psychological: alert and cooperative; normal mood and affect  Labs Reviewed  POCT URINALYSIS DIPSTICK, ED / UC - Abnormal; Notable for the following components:      Result Value   Hgb urine dipstick TRACE (*)    Protein, ur 30 (*)    All other components within normal limits  URINE CULTURE  URINALYSIS, ROUTINE W REFLEX MICROSCOPIC    ASSESSMENT & PLAN:  1. Right flank pain     Meds ordered this encounter  Medications   tamsulosin (FLOMAX) 0.4 MG CAPS capsule    Sig: Take 1 capsule (0.4 mg total) by mouth daily after breakfast.    Dispense:  30 capsule    Refill:  0   Symptoms likely from kidney stone.  Will prescribe Flomax and he was advised to  increase his fluid intake.  To return to ER for worsening of symptoms.  Discharge instructions  Urine culture sent.  We will call you with the results.   Push fluids and get plenty of rest.   Flomax was prescribed /Take as directed Follow up with PCP if symptoms persists Return here or go to ER if you have any new or worsening symptoms such as fever, worsening abdominal pain, nausea/vomiting, flank pain, etc...  Outlined signs and symptoms indicating need for more acute intervention. Patient verbalized understanding. After Visit Summary given.      Emerson Monte, [place] 07/04/21 [phone]

## 2021-07-04 NOTE — Discharge Instructions (Addendum)
Urine culture sent.  We will call you with the results.   Push fluids and get plenty of rest.   Flomax was prescribed /Take as directed Follow up with PCP if symptoms persists Return here or go to ER if you have any new or worsening symptoms such as fever, worsening abdominal pain, nausea/vomiting, flank pain, etc..Marland Kitchen

## 2021-07-05 LAB — URINE CULTURE: Culture: NO GROWTH

## 2021-08-13 ENCOUNTER — Other Ambulatory Visit: Payer: Self-pay | Admitting: Family

## 2021-08-13 DIAGNOSIS — I10 Essential (primary) hypertension: Secondary | ICD-10-CM

## 2022-08-24 ENCOUNTER — Encounter: Payer: Self-pay | Admitting: Family Medicine

## 2022-08-24 ENCOUNTER — Ambulatory Visit (INDEPENDENT_AMBULATORY_CARE_PROVIDER_SITE_OTHER): Payer: PRIVATE HEALTH INSURANCE | Admitting: Family Medicine

## 2022-08-24 VITALS — BP 160/88 | HR 66 | Temp 97.4°F | Ht 68.0 in | Wt 242.4 lb

## 2022-08-24 DIAGNOSIS — Z125 Encounter for screening for malignant neoplasm of prostate: Secondary | ICD-10-CM

## 2022-08-24 DIAGNOSIS — Z23 Encounter for immunization: Secondary | ICD-10-CM | POA: Diagnosis not present

## 2022-08-24 DIAGNOSIS — I10 Essential (primary) hypertension: Secondary | ICD-10-CM

## 2022-08-24 DIAGNOSIS — Z0000 Encounter for general adult medical examination without abnormal findings: Secondary | ICD-10-CM

## 2022-08-24 DIAGNOSIS — Z720 Tobacco use: Secondary | ICD-10-CM

## 2022-08-24 DIAGNOSIS — R0683 Snoring: Secondary | ICD-10-CM

## 2022-08-24 DIAGNOSIS — M5441 Lumbago with sciatica, right side: Secondary | ICD-10-CM

## 2022-08-24 LAB — COMPREHENSIVE METABOLIC PANEL
ALT: 20 U/L (ref 0–53)
AST: 15 U/L (ref 0–37)
Albumin: 4.1 g/dL (ref 3.5–5.2)
Alkaline Phosphatase: 57 U/L (ref 39–117)
BUN: 17 mg/dL (ref 6–23)
CO2: 31 mEq/L (ref 19–32)
Calcium: 9.3 mg/dL (ref 8.4–10.5)
Chloride: 102 mEq/L (ref 96–112)
Creatinine, Ser: 0.95 mg/dL (ref 0.40–1.50)
GFR: 83.99 mL/min (ref >60.00)
Glucose, Bld: 128 mg/dL — ABNORMAL HIGH (ref 70–99)
Potassium: 4.3 mEq/L (ref 3.5–5.1)
Sodium: 139 mEq/L (ref 135–145)
Total Bilirubin: 0.9 mg/dL (ref 0.2–1.2)
Total Protein: 6.8 g/dL (ref 6.0–8.3)

## 2022-08-24 LAB — CBC
HCT: 43.3 % (ref 39.0–52.0)
Hemoglobin: 14.4 g/dL (ref 13.0–17.0)
MCHC: 33.2 g/dL (ref 30.0–36.0)
MCV: 89 fl (ref 78.0–100.0)
Platelets: 243 10*3/uL (ref 150.0–400.0)
RBC: 4.87 Mil/uL (ref 4.22–5.81)
RDW: 13.1 % (ref 11.5–15.5)
WBC: 8.4 10*3/uL (ref 4.0–10.5)

## 2022-08-24 LAB — URINALYSIS, ROUTINE W REFLEX MICROSCOPIC
Bilirubin Urine: NEGATIVE
Ketones, ur: NEGATIVE
Leukocytes,Ua: NEGATIVE
Nitrite: NEGATIVE
RBC / HPF: NONE SEEN (ref 0–?)
Specific Gravity, Urine: 1.025 (ref 1.000–1.030)
Total Protein, Urine: 30 — AB
Urine Glucose: NEGATIVE
Urobilinogen, UA: 0.2 (ref 0.0–1.0)
WBC, UA: NONE SEEN (ref 0–?)
pH: 6 (ref 5.0–8.0)

## 2022-08-24 LAB — LIPID PANEL
Cholesterol: 166 mg/dL (ref 0–200)
HDL: 60.7 mg/dL (ref >39.00)
LDL Cholesterol: 93 mg/dL (ref 0–99)
NonHDL: 105.44
Total CHOL/HDL Ratio: 3
Triglycerides: 62 mg/dL (ref 0.0–149.0)
VLDL: 12.4 mg/dL (ref 0.0–40.0)

## 2022-08-24 LAB — PSA: PSA: 0.92 ng/mL (ref 0.10–4.00)

## 2022-08-24 MED ORDER — AMLODIPINE BESYLATE 5 MG PO TABS: 5.0000 mg | ORAL_TABLET | Freq: Every day | ORAL | 0 refills | Status: DC

## 2022-08-24 MED ORDER — METHOCARBAMOL 500 MG PO TABS: 500.0000 mg | ORAL_TABLET | Freq: Three times a day (TID) | ORAL | 0 refills | Status: AC | PRN

## 2022-08-24 MED ORDER — CARVEDILOL 12.5 MG PO TABS: 6.2500 mg | ORAL_TABLET | Freq: Two times a day (BID) | ORAL | 2 refills | Status: DC

## 2022-08-24 NOTE — Progress Notes (Signed)
Established Patient Office Visit  Subjective   Patient ID: Isaiah Rice, male    DOB: Apr 03, 1956  Age: 66 y.o. MRN: [id number]  Chief Complaint  Patient presents with   Establish Care    Establish care concerns about sciatic nerve issues in right hip come and go. Patient fasting.     HPI for reestablishment of care.  Lost to follow-up 3 years ago.  Recently seen in the emergency room with right lower back pain and diagnosed with sciatica.  Pain is improved with given medications he is feeling pain more in the buttock area on the right side.  Follow-up is scheduled with neurosurgery for November 7.  He had been out of medications for his hypertension.  ER refilled his carvedilol.  Needs to restart amlodipine.  No regular exercise.  He is seeing the dentist regularly.  Things are well at home.  He has difficulty with sleep and does snore.    Review of Systems  Constitutional: Negative.   HENT: Negative.    Eyes:  Negative for blurred vision, discharge and redness.  Respiratory: Negative.    Cardiovascular: Negative.   Gastrointestinal:  Negative for abdominal pain, blood in stool, constipation and melena.  Genitourinary:  Positive for frequency. Negative for hematuria and urgency.  Musculoskeletal:  Positive for back pain. Negative for myalgias.  Skin:  Negative for rash.  Neurological:  Negative for tingling, loss of consciousness and weakness.  Endo/Heme/Allergies:  Negative for polydipsia.      08/24/2022   10:20 AM 08/24/2022    9:50 AM  Depression screen PHQ 2/9  Decreased Interest 1 0  Down, Depressed, Hopeless 0 0  PHQ - 2 Score 1 0  Altered sleeping 3   Tired, decreased energy 2   Change in appetite 0   Feeling bad or failure about yourself  0   Trouble concentrating 0   Moving slowly or fidgety/restless 0   Suicidal thoughts 0   PHQ-9 Score 6   Difficult doing work/chores Somewhat difficult         Objective:     BP (!) 160/88 (BP Location: Right Arm,  Patient Position: Sitting, Cuff Size: Large)   Pulse 66   Temp (!) 97.4 F (36.3 C) (Temporal)   Ht '5\' 8"'$  (1.727 m)   Wt 242 lb 6.4 oz (110 kg)   SpO2 95%   BMI 36.86 kg/m  BP Readings from Last 3 Encounters:  08/24/22 (!) 160/88  07/04/21 (!) 188/83  01/03/19 (!) 146/80   Wt Readings from Last 3 Encounters:  08/24/22 242 lb 6.4 oz (110 kg)  11/29/18 243 lb 6 oz (110.4 kg)  05/03/18 223 lb 8 oz (101.4 kg)      Physical Exam Constitutional:      General: He is not in acute distress.    Appearance: Normal appearance. He is not ill-appearing, toxic-appearing or diaphoretic.  HENT:     Head: Normocephalic and atraumatic.     Right Ear: External ear normal.     Left Ear: External ear normal.     Mouth/Throat:     Mouth: Mucous membranes are moist.     Pharynx: Oropharynx is clear. No oropharyngeal exudate or posterior oropharyngeal erythema.  Eyes:     General: No scleral icterus.       Right eye: No discharge.        Left eye: No discharge.     Extraocular Movements: Extraocular movements intact.     Conjunctiva/sclera: Conjunctivae normal.  Pupils: Pupils are equal, round, and reactive to light.  Cardiovascular:     Rate and Rhythm: Normal rate and regular rhythm.  Pulmonary:     Effort: Pulmonary effort is normal. No respiratory distress.     Breath sounds: Normal breath sounds.  Abdominal:     General: Bowel sounds are normal. There is no distension.     Tenderness: There is no abdominal tenderness. There is no guarding.  Genitourinary:    Prostate: Enlarged. Not tender and no nodules present.     Rectum: Guaiac result negative. Tenderness present. No mass, anal fissure, external hemorrhoid or internal hemorrhoid. Normal anal tone.  Musculoskeletal:     Cervical back: No rigidity or tenderness.     Lumbar back: No bony tenderness. Normal range of motion. Negative right straight leg raise test and negative left straight leg raise test.  Lymphadenopathy:      Cervical: No cervical adenopathy.  Skin:    General: Skin is warm and dry.  Neurological:     Mental Status: He is alert and oriented to person, place, and time.     Motor: No weakness.     Deep Tendon Reflexes:     Reflex Scores:      Patellar reflexes are 1+ on the right side and 1+ on the left side.      Achilles reflexes are 1+ on the right side and 1+ on the left side. Psychiatric:        Mood and Affect: Mood normal.        Behavior: Behavior normal.      No results found for any visits on 08/24/22.    The ASCVD Risk score (Arnett DK, et al., 2019) failed to calculate for the following reasons:   Cannot find a previous HDL lab   Cannot find a previous total cholesterol lab    Assessment & Plan:   Problem List Items Addressed This Visit       Cardiovascular and Mediastinum   Essential hypertension - Primary   Relevant Medications   carvedilol (COREG) 12.5 MG tablet   amLODipine (NORVASC) 5 MG tablet     Nervous and Auditory   Right-sided low back pain with right-sided sciatica   Relevant Medications   predniSONE (DELTASONE) 20 MG tablet   HYDROcodone-acetaminophen (NORCO/VICODIN) 5-325 MG tablet   methocarbamol (ROBAXIN) 500 MG tablet     Other   Healthcare maintenance   Relevant Orders   CBC   Comprehensive metabolic panel   Lipid panel   PSA   Urinalysis, Routine w reflex microscopic   Need for immunization against influenza   Relevant Orders   Flu vaccine HIGH DOSE PF (Fluzone High dose) (Completed)   Snores   Relevant Orders   Ambulatory referral to Pulmonology   Morbid obesity ([place])   Tobacco use   Other Visit Diagnoses     Need for pneumococcal 20-valent conjugate vaccination       Relevant Orders   Pneumococcal conjugate vaccine 20-valent (Completed)       Return in about 6 weeks (around 10/05/2022).  Has follow-up scheduled with neurosurgery on November 7.  Flu shot and Prevnar 20 vaccine today.  Will get Tdap and start Shingrix  vaccine series next visit.  Referral for sleep study.  We will discuss tobacco use and alcohol use next visit.  Patient was given information on exercising to lose weight.  Will restart amlodipine 5 mg to be taken with the carvedilol 6.25 twice daily.  Information  was given on managing hypertension.  Libby Maw, MD

## 2022-09-14 ENCOUNTER — Encounter: Payer: Self-pay | Admitting: Primary Care

## 2022-09-14 ENCOUNTER — Ambulatory Visit (INDEPENDENT_AMBULATORY_CARE_PROVIDER_SITE_OTHER): Payer: PRIVATE HEALTH INSURANCE | Admitting: Primary Care

## 2022-09-14 VITALS — BP 140/80 | HR 76 | Temp 98.6°F | Ht 67.0 in | Wt 245.0 lb

## 2022-09-14 DIAGNOSIS — R0683 Snoring: Secondary | ICD-10-CM | POA: Diagnosis not present

## 2022-09-14 NOTE — Assessment & Plan Note (Addendum)
-   Patient has symptoms of snoring and disruptive sleep.  Epworth score 5/24. BMI 38.  Concern patient could have sleep apnea, needs home sleep study to evaluate.  We reviewed risks of untreated sleep apnea including cardiac arrhythmias, pulmonary hypertension, stroke or diabetes.  We also discussed treatment options including weight loss, oral appliance, CPAP therapy or referral to ENT for possible surgical options.  Back issues could be contributing to restless sleep.  If needed patient is open to starting CPAP.  Encourage weight loss efforts, side sleeping position or elevating head of bed 30 degrees at night.  Advised against driving experiencing excessive daytime sleepiness fatigue and excessive alcohol use prior to bedtime.  Follow-up 1 to 2 weeks after sleep study to review results and treatment options if needed.

## 2022-09-14 NOTE — Progress Notes (Signed)
$'@Patient'Z$  ID: Theodoro Parma, male    DOB: 04-10-1956, 66 y.o.   MRN: [id number]  Chief Complaint  Patient presents with   Consult    Referring provider: Libby Maw  HPI: 66 year old male, some day smoker.  Past medical history significant HTN, GERD, anxiety, tobacco use.  09/14/2022 Patient presents today for sleep consult. Patient reports symptoms of snoring and disruptive/fragmented sleep. Symptoms started 8 months ago. Previous to this he was a sound sleeper. He has back pain/sciatic nerve issues. He completed physical therapy last year which helped. He has apt next week with spine doctor.  Typical bedtime is between 11 and 11:30 PM.  It can take him up to 45 minutes to fall asleep. He has been taking sleep EZ this week for troubles sleeping which has helped.  He typically sleeps on his side.  He wakes up on average 3-4 times a night.  He starts his day at 6:30 AM.  His weight is up 15 to 20 pounds.  He has never had a sleep study.  He does not wear CPAP or oxygen.  Epworth score is 5.  Denies symptoms of narcolepsy, cataplexy or sleepwalking.  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  Do you currently wear CPAP- no Do you current wear oxygen- no Epworth- 5  No Known Allergies  Immunization History  Administered Date(s) Administered   H1N1 10/22/2008   Influenza, High Dose Seasonal PF 08/24/2022   Influenza,inj,Quad PF,6+ Mos 11/29/2018   PNEUMOCOCCAL CONJUGATE-20 08/24/2022    Past Medical History:  Diagnosis Date   Allergy, unspecified not elsewhere classified    Anxiety    DJD (degenerative joint disease)    Esophageal reflux    Hypertension     Tobacco History: Social History   Tobacco Use   Smoking Status Some Days   Types: Cigars  Smokeless Tobacco Never  Tobacco Comments   did smoke an occasional cigar once a year   Ready to quit: Not Answered Counseling given: Not Answered Tobacco comments: did smoke an occasional cigar once a year   Outpatient Medications Prior to Visit  Medication Sig Dispense Refill   amLODipine (NORVASC) 5 MG tablet Take 1 tablet (5 mg total) by mouth daily. 90 tablet 0   carvedilol (COREG) 12.5 MG tablet Take 0.5 tablets (6.25 mg total) by mouth 2 (two) times daily. 60 tablet 2   etodolac (LODINE) 300 MG capsule Take 1 capsule (300 mg total) by mouth 3 (three) times daily. As needed. (Patient not taking: Reported on 08/24/2022) 60 capsule 1   HYDROcodone-acetaminophen (NORCO/VICODIN) 5-325 MG tablet Take 1 tablet by mouth every 8 (eight) hours as needed.     methocarbamol (ROBAXIN) 500 MG tablet Take 1 tablet (500 mg total) by mouth every 8 (eight) hours as needed for muscle spasms. 60 tablet 0   predniSONE (DELTASONE) 20 MG tablet Take by mouth.     No facility-administered medications prior to visit.   Review of Systems  Review of Systems  Constitutional: Negative.   HENT: Negative.    Respiratory: Negative.    Psychiatric/Behavioral:  Positive for sleep disturbance.     Physical Exam  BP (!) 140/80   Pulse 76   Temp 98.6 F (37 C)   Ht '5\' 7"'$  (1.702  m)   Wt 245 lb (111.1 kg)   SpO2 97%   BMI 38.37 kg/m  Physical Exam Constitutional:      General: He is not in acute distress.    Appearance: Normal appearance. He is obese. He is not ill-appearing.  HENT:     Head: Normocephalic and atraumatic.     Mouth/Throat:     Mouth: Mucous membranes are moist.     Pharynx: Oropharynx is clear.     Comments: Mallampati class III Cardiovascular:     Rate and Rhythm: Normal rate and regular rhythm.  Pulmonary:     Effort: Pulmonary effort is normal.     Breath sounds: Normal breath sounds.  Musculoskeletal:        General: Normal  range of motion.     Cervical back: Normal range of motion and neck supple.  Skin:    General: Skin is warm and dry.  Neurological:     General: No focal deficit present.     Mental Status: He is alert and oriented to person, place, and time. Mental status is at baseline.  Psychiatric:        Mood and Affect: Mood normal.        Behavior: Behavior normal.        Thought Content: Thought content normal.        Judgment: Judgment normal.      Lab Results:  CBC    Component Value Date/Time   WBC 8.4 08/24/2022 1040   RBC 4.87 08/24/2022 1040   HGB 14.4 08/24/2022 1040   HCT 43.3 08/24/2022 1040   PLT 243.0 08/24/2022 1040   MCV 89.0 08/24/2022 1040   MCHC 33.2 08/24/2022 1040   RDW 13.1 08/24/2022 1040   LYMPHSABS 2.0 02/21/2011 1344   MONOABS 0.6 02/21/2011 1344   EOSABS 0.1 02/21/2011 1344   BASOSABS 0.0 02/21/2011 1344    BMET    Component Value Date/Time   NA 139 08/24/2022 1040   K 4.3 08/24/2022 1040   CL 102 08/24/2022 1040   CO2 31 08/24/2022 1040   GLUCOSE 128 (H) 08/24/2022 1040   BUN 17 08/24/2022 1040   CREATININE 0.95 08/24/2022 1040   CALCIUM 9.3 08/24/2022 1040   GFRNONAA 75 10/22/2008 0000   GFRAA 91 10/22/2008 0000    BNP No results found for: "BNP"  ProBNP No results found for: "PROBNP"  Imaging: No results found.   Assessment & Plan:   Loud snoring - Patient has symptoms of snoring and disruptive sleep.  Epworth score 5/24. BMI 38.  Concern patient could have sleep apnea, needs home sleep study to evaluate.  We reviewed risks of untreated sleep apnea including cardiac arrhythmias, pulmonary hypertension, stroke or diabetes.  We also discussed treatment options including weight loss, oral appliance, CPAP therapy or referral to ENT for possible surgical options.  Back issues could be contributing to restless sleep.  If needed patient is open to starting CPAP.  Encourage weight loss efforts, side sleeping position or elevating head of bed 30  degrees at night.  Advised against driving experiencing excessive daytime sleepiness fatigue and excessive alcohol use prior to bedtime.  Follow-up 1 to 2 weeks after sleep study to review results and treatment options if needed.   Martyn Ehrich, NP 09/14/2022

## 2022-09-14 NOTE — Patient Instructions (Addendum)
  Sleep apnea is defined as period of 10 seconds or longer when you stop breathing at night. This can happen multiple times a night. Dx sleep apnea is when this occurs more than 5 times an hour.    Mild OSA 5-15 apneic events an hour Moderate OSA 15-30 apneic events an hour Severe OSA > 30 apneic events an hour   Untreated sleep apnea puts you at higher risk for cardiac arrhythmias, pulmonary HTN, stroke and diabetes   Treatment options include weight loss, side sleeping position, oral appliance, CPAP therapy or referral to ENT for possible surgical options    Recommendations: Focus on side sleeping position or elevate head with wedge pillow 30 degrees Work on weight loss efforts if able  Do not drive if experiencing excessive daytime sleepiness of fatigue    Orders: Recheck BP  Home sleep study re: loud snoring (ordered)   Follow-up: Please call to schedule follow-up 1-2 weeks after completing home sleep study to review results and treatment if needed (can be virtual)

## 2022-09-16 NOTE — Progress Notes (Signed)
Reviewed and agree with assessment/plan.   Sheniece Ruggles, MD [facility] 09/16/2022, 7:43 AM Pager:  [phone]  

## 2022-09-28 ENCOUNTER — Encounter: Payer: Self-pay | Admitting: Family Medicine

## 2022-09-28 ENCOUNTER — Ambulatory Visit (INDEPENDENT_AMBULATORY_CARE_PROVIDER_SITE_OTHER): Payer: PRIVATE HEALTH INSURANCE | Admitting: Family Medicine

## 2022-09-28 VITALS — BP 167/95 | HR 65 | Temp 98.2°F | Ht 67.0 in | Wt 244.0 lb

## 2022-09-28 DIAGNOSIS — Z23 Encounter for immunization: Secondary | ICD-10-CM | POA: Diagnosis not present

## 2022-09-28 DIAGNOSIS — R7309 Other abnormal glucose: Secondary | ICD-10-CM

## 2022-09-28 DIAGNOSIS — I10 Essential (primary) hypertension: Secondary | ICD-10-CM

## 2022-09-28 LAB — HEMOGLOBIN A1C: Hgb A1c MFr Bld: 5.6 % (ref 4.6–6.5)

## 2022-09-28 MED ORDER — CARVEDILOL 12.5 MG PO TABS: 12.5000 mg | ORAL_TABLET | Freq: Two times a day (BID) | ORAL | 2 refills | Status: DC

## 2022-09-28 MED ORDER — AMLODIPINE BESYLATE 5 MG PO TABS: 5.0000 mg | ORAL_TABLET | Freq: Every day | ORAL | 0 refills | Status: DC

## 2022-09-28 NOTE — Progress Notes (Signed)
Established Patient Office Visit  Subjective   Patient ID: Isaiah Rice, male    DOB: 01-14-1956  Age: 66 y.o. MRN: [id number]  Chief Complaint  Patient presents with   Follow-up    6 week follow HTN pt not fasting    HPI for follow-up of hypertension and elevated glucose.  Was confused.  He was not taking both of his blood pressure medicines.  Somehow he was taking the amlodipine on 1 day and then the Coreg twice daily on another day.  Admits to increased sodium.  He rarely smokes cigars.  He has not had any alcohol since last seen.  He has seen pulmonology for evaluation of apnea.  He is under care by neurosurgery for sciatica.  MRI is pending    Review of Systems  Constitutional: Negative.   HENT: Negative.    Eyes:  Negative for blurred vision, discharge and redness.  Respiratory: Negative.    Cardiovascular: Negative.   Gastrointestinal:  Negative for abdominal pain.  Genitourinary: Negative.   Musculoskeletal: Negative.  Negative for myalgias.  Skin:  Negative for rash.  Neurological:  Negative for tingling, loss of consciousness and weakness.  Endo/Heme/Allergies:  Negative for polydipsia.      Objective:     BP (!) 167/95 (BP Location: Right Arm, Patient Position: Sitting, Cuff Size: Large)   Pulse 65   Temp 98.2 F (36.8 C) (Temporal)   Ht '5\' 7"'$  (1.702 m)   Wt 244 lb (110.7 kg)   SpO2 96%   BMI 38.22 kg/m  BP Readings from Last 3 Encounters:  09/28/22 (!) 167/95  09/14/22 (!) 140/80  08/24/22 (!) 160/88   Wt Readings from Last 3 Encounters:  09/28/22 244 lb (110.7 kg)  09/14/22 245 lb (111.1 kg)  08/24/22 242 lb 6.4 oz (110 kg)      Physical Exam Constitutional:      General: He is not in acute distress.    Appearance: Normal appearance. He is not ill-appearing, toxic-appearing or diaphoretic.  HENT:     Head: Normocephalic and atraumatic.     Right Ear: External ear normal.     Left Ear: External ear normal.  Eyes:     General: No scleral  icterus.       Right eye: No discharge.        Left eye: No discharge.     Extraocular Movements: Extraocular movements intact.     Conjunctiva/sclera: Conjunctivae normal.  Cardiovascular:     Rate and Rhythm: Normal rate and regular rhythm.  Pulmonary:     Effort: Pulmonary effort is normal. No respiratory distress.     Breath sounds: Normal breath sounds.  Musculoskeletal:     Cervical back: No rigidity or tenderness.  Skin:    General: Skin is warm and dry.  Neurological:     Mental Status: He is alert and oriented to person, place, and time.  Psychiatric:        Mood and Affect: Mood normal.        Behavior: Behavior normal.      No results found for any visits on 09/28/22.    The 10-year ASCVD risk score (Arnett DK, et al., 2019) is: 36.3%    Assessment & Plan:   Problem List Items Addressed This Visit       Cardiovascular and Mediastinum   Essential hypertension - Primary   Relevant Medications   carvedilol (COREG) 12.5 MG tablet   amLODipine (NORVASC) 5 MG tablet  Other   Elevated glucose   Relevant Orders   Hemoglobin A1c   Need for Tdap vaccination   Relevant Orders   Tdap vaccine greater than or equal to 7yo IM   Need for shingles vaccine   Relevant Orders   Varicella-zoster vaccine IM    Return in about 1 month (around 10/28/2022), or Please take amlodipine'5mg'$   once daily and the Coreg12.5 twice daily..  Patient will take the amlodipine daily and the Coreg twice daily.  Checking hemoglobin A1c because of elevated glucose.  Information was given on managing hypertension.  Libby Maw, MD

## 2022-10-26 ENCOUNTER — Ambulatory Visit (INDEPENDENT_AMBULATORY_CARE_PROVIDER_SITE_OTHER): Payer: PRIVATE HEALTH INSURANCE | Admitting: Family Medicine

## 2022-10-26 ENCOUNTER — Encounter: Payer: Self-pay | Admitting: Family Medicine

## 2022-10-26 VITALS — BP 162/80 | HR 72 | Temp 97.3°F | Ht 67.0 in | Wt 240.6 lb

## 2022-10-26 DIAGNOSIS — I10 Essential (primary) hypertension: Secondary | ICD-10-CM | POA: Diagnosis not present

## 2022-10-26 MED ORDER — CARVEDILOL 12.5 MG PO TABS: 12.5000 mg | ORAL_TABLET | Freq: Two times a day (BID) | ORAL | 2 refills | Status: DC

## 2022-10-26 MED ORDER — AMLODIPINE BESYLATE 10 MG PO TABS: 10.0000 mg | ORAL_TABLET | Freq: Every day | ORAL | 0 refills | Status: DC

## 2022-10-26 NOTE — Progress Notes (Signed)
Established Patient Office Visit   Subjective:  Patient ID: Isaiah Rice, male    DOB: [birth year]  Age: 66 y.o. MRN: [id number]  Chief Complaint  Patient presents with   Follow-up    1 month follow up, no concerns. Patient fasting.     HPI Encounter Diagnoses  Name Primary?   Essential hypertension Yes   Continues with amlodipine 5 mg daily and carvedilol 12.5 mg twice daily.  Blood pressure is lower but continues to run in the lower 150s.  He is not at goal.  He is tolerating both drugs well.  He has been working on losing weight.  Difficult for him to exercise with the back pain that he is experiencing.   Review of Systems  Constitutional: Negative.   HENT: Negative.    Eyes:  Negative for blurred vision, discharge and redness.  Respiratory: Negative.    Cardiovascular: Negative.   Gastrointestinal:  Negative for abdominal pain.  Genitourinary: Negative.   Musculoskeletal:  Positive for back pain. Negative for myalgias.  Skin:  Negative for rash.  Neurological:  Negative for tingling, loss of consciousness and weakness.  Endo/Heme/Allergies:  Negative for polydipsia.     Current Outpatient Medications:    amLODipine (NORVASC) 10 MG tablet, Take 1 tablet (10 mg total) by mouth daily., Disp: 90 tablet, Rfl: 0   methocarbamol (ROBAXIN) 500 MG tablet, Take 1 tablet (500 mg total) by mouth every 8 (eight) hours as needed for muscle spasms., Disp: 60 tablet, Rfl: 0   carvedilol (COREG) 12.5 MG tablet, Take 1 tablet (12.5 mg total) by mouth 2 (two) times daily., Disp: 60 tablet, Rfl: 2   Objective:     BP (!) 162/80 (BP Location: Left Arm, Patient Position: Sitting, Cuff Size: Large)   Pulse 72   Temp (!) 97.3 F (36.3 C) (Temporal)   Ht '5\' 7"'$  (1.702 m)   Wt 240 lb 9.6 oz (109.1 kg)   SpO2 96%   BMI 37.68 kg/m  BP Readings from Last 3 Encounters:  10/26/22 (!) 162/80  09/28/22 (!) 167/95  09/14/22 (!) 140/80   Wt Readings from Last 3 Encounters:  10/26/22 240  lb 9.6 oz (109.1 kg)  09/28/22 244 lb (110.7 kg)  09/14/22 245 lb (111.1 kg)      Physical Exam Constitutional:      General: He is not in acute distress.    Appearance: Normal appearance. He is not ill-appearing, toxic-appearing or diaphoretic.  HENT:     Head: Normocephalic and atraumatic.     Right Ear: External ear normal.     Left Ear: External ear normal.  Eyes:     General: No scleral icterus.       Right eye: No discharge.        Left eye: No discharge.     Extraocular Movements: Extraocular movements intact.     Conjunctiva/sclera: Conjunctivae normal.  Cardiovascular:     Rate and Rhythm: Normal rate and regular rhythm.  Pulmonary:     Effort: Pulmonary effort is normal. No respiratory distress.     Breath sounds: Normal breath sounds.  Abdominal:     Tenderness: There is no abdominal tenderness.  Musculoskeletal:     Cervical back: No rigidity or tenderness.     Right lower leg: No edema.     Left lower leg: No edema.  Skin:    General: Skin is warm and dry.  Neurological:     Mental Status: He is alert and oriented  to person, place, and time.  Psychiatric:        Mood and Affect: Mood normal.        Behavior: Behavior normal.      No results found for any visits on 10/26/22.    The 10-year ASCVD risk score (Arnett DK, et al., 2019) is: 34.6%    Assessment & Plan:   Essential hypertension -     amLODIPine Besylate; Take 1 tablet (10 mg total) by mouth daily.  Dispense: 90 tablet; Refill: 0 -     Carvedilol; Take 1 tablet (12.5 mg total) by mouth 2 (two) times daily.  Dispense: 60 tablet; Refill: 2    Return in about 8 weeks (around 12/21/2022).  Information was given on managing hypertension.  Have increased amlodipine to 10 mg daily.  Advised that this medication works over time.  Continue carvedilol 12.5 twice daily.  Advised him to avoid salt.  He will start to exercise as soon as he is able.  Libby Maw, MD

## 2022-12-07 ENCOUNTER — Ambulatory Visit: Payer: PRIVATE HEALTH INSURANCE | Admitting: Primary Care

## 2022-12-07 DIAGNOSIS — R0683 Snoring: Secondary | ICD-10-CM

## 2022-12-07 DIAGNOSIS — G4733 Obstructive sleep apnea (adult) (pediatric): Secondary | ICD-10-CM | POA: Diagnosis not present

## 2022-12-14 DIAGNOSIS — G4733 Obstructive sleep apnea (adult) (pediatric): Secondary | ICD-10-CM | POA: Diagnosis not present

## 2022-12-16 NOTE — Progress Notes (Signed)
Please let patient know HST showed severe sleep apnea. Needs OV to discuss starting CPAP

## 2022-12-21 ENCOUNTER — Ambulatory Visit: Payer: PRIVATE HEALTH INSURANCE | Admitting: Family Medicine

## 2022-12-21 ENCOUNTER — Telehealth: Payer: Self-pay | Admitting: Family Medicine

## 2022-12-21 NOTE — Telephone Encounter (Signed)
2.7.24 no show letter sent

## 2022-12-26 NOTE — Telephone Encounter (Signed)
1st no show, fee waived, letter sent to reschedule/notify of policy

## 2023-01-02 NOTE — Progress Notes (Signed)
Can we re-call patient and make sure he schedules a visit with me to go over sleep study

## 2023-02-01 ENCOUNTER — Other Ambulatory Visit: Payer: Self-pay | Admitting: Family Medicine

## 2023-02-01 DIAGNOSIS — I10 Essential (primary) hypertension: Secondary | ICD-10-CM

## 2023-03-06 ENCOUNTER — Other Ambulatory Visit: Payer: Self-pay | Admitting: Family Medicine

## 2023-03-06 DIAGNOSIS — I10 Essential (primary) hypertension: Secondary | ICD-10-CM

## 2023-03-22 ENCOUNTER — Other Ambulatory Visit: Payer: Self-pay | Admitting: Family Medicine

## 2023-03-22 DIAGNOSIS — I10 Essential (primary) hypertension: Secondary | ICD-10-CM

## 2023-04-09 ENCOUNTER — Other Ambulatory Visit: Payer: Self-pay | Admitting: Family Medicine

## 2023-04-09 DIAGNOSIS — I10 Essential (primary) hypertension: Secondary | ICD-10-CM

## 2023-05-15 ENCOUNTER — Other Ambulatory Visit: Payer: Self-pay | Admitting: Family Medicine

## 2023-05-15 DIAGNOSIS — I10 Essential (primary) hypertension: Secondary | ICD-10-CM

## 2023-06-23 ENCOUNTER — Other Ambulatory Visit: Payer: Self-pay | Admitting: Family Medicine

## 2023-06-23 DIAGNOSIS — I10 Essential (primary) hypertension: Secondary | ICD-10-CM

## 2023-07-07 ENCOUNTER — Other Ambulatory Visit: Payer: Self-pay | Admitting: Family Medicine

## 2023-07-07 DIAGNOSIS — I10 Essential (primary) hypertension: Secondary | ICD-10-CM

## 2023-09-13 ENCOUNTER — Other Ambulatory Visit: Payer: Self-pay | Admitting: Family Medicine

## 2023-09-13 DIAGNOSIS — I10 Essential (primary) hypertension: Secondary | ICD-10-CM

## 2023-09-17 ENCOUNTER — Other Ambulatory Visit: Payer: Self-pay | Admitting: Family Medicine

## 2023-09-17 DIAGNOSIS — I10 Essential (primary) hypertension: Secondary | ICD-10-CM
